# Patient Record
Sex: Female | Born: 1970
Health system: Southern US, Community
[De-identification: ages and names within clinical notes are randomized; demographics above are authoritative.]

## PROBLEM LIST (undated history)

## (undated) DIAGNOSIS — E785 Hyperlipidemia, unspecified: Secondary | ICD-10-CM

## (undated) DIAGNOSIS — Z9109 Other allergy status, other than to drugs and biological substances: Secondary | ICD-10-CM

## (undated) DIAGNOSIS — J45909 Unspecified asthma, uncomplicated: Secondary | ICD-10-CM

## (undated) DIAGNOSIS — Z90710 Acquired absence of both cervix and uterus: Secondary | ICD-10-CM

## (undated) DIAGNOSIS — D259 Leiomyoma of uterus, unspecified: Secondary | ICD-10-CM

## (undated) DIAGNOSIS — R062 Wheezing: Secondary | ICD-10-CM

## (undated) DIAGNOSIS — I10 Essential (primary) hypertension: Secondary | ICD-10-CM

## (undated) DIAGNOSIS — E669 Obesity, unspecified: Secondary | ICD-10-CM

## (undated) DIAGNOSIS — T7840XA Allergy, unspecified, initial encounter: Secondary | ICD-10-CM

## (undated) DIAGNOSIS — R739 Hyperglycemia, unspecified: Secondary | ICD-10-CM

## (undated) HISTORY — PX: ABDOMINAL HYSTERECTOMY: SHX81

## (undated) HISTORY — DX: Leiomyoma of uterus, unspecified: D25.9

## (undated) HISTORY — PX: FRACTURE SURGERY: SHX138

## (undated) HISTORY — DX: Allergy, unspecified, initial encounter: T78.40XA

## (undated) HISTORY — DX: Obesity, unspecified: E66.9

---
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
## 2016-10-29 DIAGNOSIS — M9901 Segmental and somatic dysfunction of cervical region: Secondary | ICD-10-CM | POA: Diagnosis not present

## 2016-10-29 DIAGNOSIS — M5136 Other intervertebral disc degeneration, lumbar region: Secondary | ICD-10-CM | POA: Diagnosis not present

## 2016-10-29 DIAGNOSIS — M9903 Segmental and somatic dysfunction of lumbar region: Secondary | ICD-10-CM | POA: Diagnosis not present

## 2016-10-29 DIAGNOSIS — M9905 Segmental and somatic dysfunction of pelvic region: Secondary | ICD-10-CM | POA: Diagnosis not present

## 2016-10-30 DIAGNOSIS — M9901 Segmental and somatic dysfunction of cervical region: Secondary | ICD-10-CM | POA: Diagnosis not present

## 2016-10-30 DIAGNOSIS — M5136 Other intervertebral disc degeneration, lumbar region: Secondary | ICD-10-CM | POA: Diagnosis not present

## 2016-10-30 DIAGNOSIS — M9903 Segmental and somatic dysfunction of lumbar region: Secondary | ICD-10-CM | POA: Diagnosis not present

## 2016-10-30 DIAGNOSIS — M9905 Segmental and somatic dysfunction of pelvic region: Secondary | ICD-10-CM | POA: Diagnosis not present

## 2016-11-03 DIAGNOSIS — M9901 Segmental and somatic dysfunction of cervical region: Secondary | ICD-10-CM | POA: Diagnosis not present

## 2016-11-03 DIAGNOSIS — M5136 Other intervertebral disc degeneration, lumbar region: Secondary | ICD-10-CM | POA: Diagnosis not present

## 2016-11-03 DIAGNOSIS — M9903 Segmental and somatic dysfunction of lumbar region: Secondary | ICD-10-CM | POA: Diagnosis not present

## 2016-11-03 DIAGNOSIS — M9905 Segmental and somatic dysfunction of pelvic region: Secondary | ICD-10-CM | POA: Diagnosis not present

## 2016-11-04 DIAGNOSIS — M9905 Segmental and somatic dysfunction of pelvic region: Secondary | ICD-10-CM | POA: Diagnosis not present

## 2016-11-04 DIAGNOSIS — M5136 Other intervertebral disc degeneration, lumbar region: Secondary | ICD-10-CM | POA: Diagnosis not present

## 2016-11-04 DIAGNOSIS — M9901 Segmental and somatic dysfunction of cervical region: Secondary | ICD-10-CM | POA: Diagnosis not present

## 2016-11-04 DIAGNOSIS — M9903 Segmental and somatic dysfunction of lumbar region: Secondary | ICD-10-CM | POA: Diagnosis not present

## 2016-11-07 DIAGNOSIS — M9905 Segmental and somatic dysfunction of pelvic region: Secondary | ICD-10-CM | POA: Diagnosis not present

## 2016-11-07 DIAGNOSIS — M9903 Segmental and somatic dysfunction of lumbar region: Secondary | ICD-10-CM | POA: Diagnosis not present

## 2016-11-07 DIAGNOSIS — M5136 Other intervertebral disc degeneration, lumbar region: Secondary | ICD-10-CM | POA: Diagnosis not present

## 2016-11-07 DIAGNOSIS — M9901 Segmental and somatic dysfunction of cervical region: Secondary | ICD-10-CM | POA: Diagnosis not present

## 2016-11-10 DIAGNOSIS — M9901 Segmental and somatic dysfunction of cervical region: Secondary | ICD-10-CM | POA: Diagnosis not present

## 2016-11-10 DIAGNOSIS — M5136 Other intervertebral disc degeneration, lumbar region: Secondary | ICD-10-CM | POA: Diagnosis not present

## 2016-11-10 DIAGNOSIS — M9903 Segmental and somatic dysfunction of lumbar region: Secondary | ICD-10-CM | POA: Diagnosis not present

## 2016-11-10 DIAGNOSIS — M9905 Segmental and somatic dysfunction of pelvic region: Secondary | ICD-10-CM | POA: Diagnosis not present

## 2016-11-14 DIAGNOSIS — M5136 Other intervertebral disc degeneration, lumbar region: Secondary | ICD-10-CM | POA: Diagnosis not present

## 2016-11-14 DIAGNOSIS — M9901 Segmental and somatic dysfunction of cervical region: Secondary | ICD-10-CM | POA: Diagnosis not present

## 2016-11-14 DIAGNOSIS — M9903 Segmental and somatic dysfunction of lumbar region: Secondary | ICD-10-CM | POA: Diagnosis not present

## 2016-11-14 DIAGNOSIS — M9905 Segmental and somatic dysfunction of pelvic region: Secondary | ICD-10-CM | POA: Diagnosis not present

## 2017-05-21 DIAGNOSIS — I10 Essential (primary) hypertension: Secondary | ICD-10-CM | POA: Diagnosis not present

## 2017-05-21 DIAGNOSIS — Z90710 Acquired absence of both cervix and uterus: Secondary | ICD-10-CM | POA: Diagnosis not present

## 2017-05-21 DIAGNOSIS — Z6827 Body mass index (BMI) 27.0-27.9, adult: Secondary | ICD-10-CM | POA: Diagnosis not present

## 2017-05-21 DIAGNOSIS — Z0000 Encounter for general adult medical examination without abnormal findings: Secondary | ICD-10-CM | POA: Diagnosis not present

## 2017-05-21 DIAGNOSIS — D649 Anemia, unspecified: Secondary | ICD-10-CM | POA: Diagnosis not present

## 2018-08-19 ENCOUNTER — Emergency Department (INDEPENDENT_AMBULATORY_CARE_PROVIDER_SITE_OTHER)
Admission: EM | Admit: 2018-08-19 | Discharge: 2018-08-19 | Disposition: A | Discharge status: Home / Self Care | Payer: BLUE CROSS/BLUE SHIELD | Source: Home / Self Care | Attending: Family Medicine | Admitting: Family Medicine

## 2018-08-19 ENCOUNTER — Other Ambulatory Visit: Payer: Self-pay

## 2018-08-19 ENCOUNTER — Encounter: Payer: Self-pay | Admitting: *Deleted

## 2018-08-19 DIAGNOSIS — H539 Unspecified visual disturbance: Secondary | ICD-10-CM

## 2018-08-19 DIAGNOSIS — I10 Essential (primary) hypertension: Secondary | ICD-10-CM | POA: Diagnosis not present

## 2018-08-19 HISTORY — DX: Essential (primary) hypertension: I10

## 2018-08-19 LAB — POCT CBC W AUTO DIFF (K'VILLE URGENT CARE)

## 2018-08-19 NOTE — ED Triage Notes (Signed)
Patient c/o 3-4 days of intermittent "glazed vision", more frequent in the past day. She has also has noted feeling more easily winded with exertion. She is in need of a refill on her BP medication this month and a PCP. She still has a supply. Moved away from PCP.

## 2018-08-19 NOTE — ED Provider Notes (Signed)
[facility]    CSN: [id number] Arrival date & time: 08/19/18  1358     History   Chief Complaint Chief Complaint  Patient presents with  . Blurred Vision  . Hypertension    HPI Ashley Roberts is a 47 y.o. female.   Patient has had hypertension for 20+ years, controlled with amlodipine and Benicar.  During the past  3 to 4 days she has been fatigued, becoming more easily winded with activity and feels tight in her chest with inspiration.  She has had intermittent "glazed vision" but denies other neurologic symptoms.  She reports that she has moved to the area and is in need of a PCP.     Past Medical History:  Diagnosis Date  . Hypertension     Active problems:  hypertension    Past Surgical History:  Procedure Laterality Date  . ABDOMINAL HYSTERECTOMY      OB History   Noncontributory      Home Medications    Prior to Admission medications   Medication Sig Start Date End Date Taking? Authorizing Provider  AMLODIPINE BESYLATE PO Take 5 mg by mouth.   Yes [provider]  olmesartan-hydrochlorothiazide (BENICAR HCT) 40-25 MG tablet Take 1 tablet by mouth daily.   Yes [provider]    Family History Family History  Problem Relation Age of Onset  . Hypertension Mother     Social History Social History   Tobacco Use  . Smoking status: Never Smoker  . Smokeless tobacco: Never Used  Substance Use Topics  . Alcohol use: Yes  . Drug use: Never     Allergies   Patient has no known allergies.   Review of Systems Review of Systems  Constitutional: Negative for activity change, appetite change, chills, diaphoresis, fatigue, fever and unexpected weight change.  HENT: Negative.   Eyes: Positive for visual disturbance.  Respiratory: Positive for shortness of breath. Negative for cough, chest tightness, wheezing and stridor.   Cardiovascular: Negative.   Gastrointestinal: Negative.   Genitourinary: Negative.     Musculoskeletal: Negative.   Skin: Negative.   Neurological: Negative for dizziness, seizures, syncope, facial asymmetry, speech difficulty, weakness, light-headedness, numbness and headaches.     Physical Exam Triage Vital Signs ED Triage Vitals [08/19/18 1430]  Enc Vitals Group     BP (!) 147/93     Pulse Rate 77     Resp 14     Temp 98.9 F (37.2 C)     Temp Source Oral     SpO2 99 %     Weight 231 lb (104.8 kg)     Height 5\' 9"  (1.753 m)     Head Circumference      Peak Flow      Pain Score 0     Pain Loc      Pain Edu?      Excl. in [place]?    No data found.  Updated Vital Signs BP (!) 147/93 (BP Location: Right Arm)   Pulse 77   Temp 98.9 F (37.2 C) (Oral)   Resp 14   Ht 5\' 9"  (1.753 m)   Wt 104.8 kg   SpO2 99%   BMI 34.11 kg/m   Visual Acuity Right Eye Distance: 20/30 Left Eye Distance: 20/30 Bilateral Distance: 20/30(glasses)  Right Eye Near:   Left Eye Near:    Bilateral Near:     Physical Exam Nursing notes and Vital Signs reviewed. Appearance:  Patient appears stated age,  and in no acute distress Eyes:  Pupils are equal, round, and reactive to light and accomodation.  Extraocular movement is intact.  Conjunctivae are not inflamed.  Fundi benign.  Ears:  Canals normal.  Tympanic membranes normal.  Nose:   Normal turbinates.  No sinus tenderness.    Pharynx:  Normal Neck:  Supple.  No adenopathy or thyromegaly.  Carotids have normal upstrokes without bruits. Lungs:  Clear to auscultation.  Breath sounds are equal.  Moving air well. Heart:  Regular rate and rhythm without murmurs, rubs, or gallops.  Abdomen:  Nontender without masses or hepatosplenomegaly.  Bowel sounds are present.  No CVA or flank tenderness.  Extremities:  No edema.  Pedal pulses intact. Skin:  No rash present.   Neurologic:  Cranial nerves 2 through 12 are normal.  Patellar, achilles, and elbow reflexes are normal.  Cerebellar function is intact (finger-to-nose and rapid  alternating hand movement).  Gait and station are normal.   .   UC Treatments / Results  Labs (all labs ordered are listed, but only abnormal results are displayed) Labs Reviewed  POCT CBC W AUTO DIFF ([facility]):  WBC 7.2; LY 31.2; MO 5.2; GR 63.6; Hgb 13.5; Platelets 255    EKG  Rate:  70 BPM PR:  140 msec QT:  392 msec QTcH:  423 msec QRSD:  78 msec QRS axis:  23 degrees Interpretation:  within normal limits   Radiology No results found.  Procedures Procedures (including critical care time)  Medications Ordered in UC Medications - No data to display  Initial Impression / Assessment and Plan / UC Course  I have reviewed the triage vital signs and the nursing notes.  Pertinent labs & imaging results that were available during my care of the patient were reviewed by me and considered in my medical decision making (see chart for details).    Benign exam today. Followup with PCP for BP management.  Recommend follow-up with ophthalmologist for annual eye exam   Final Clinical Impressions(s) / UC Diagnoses   Final diagnoses:  Essential hypertension, benign  Vision changes     Discharge Instructions     Continue present medications.  Minimize salt and sodium intake. Monitor blood pressure more frequently at different times of day and record on a calendar.     ED Prescriptions    None        Kandra Nicolas, MD 08/22/18 2110

## 2018-08-19 NOTE — Discharge Instructions (Addendum)
Continue present medications.  Minimize salt and sodium intake. Monitor blood pressure more frequently at different times of day and record on a calendar.

## 2018-08-24 ENCOUNTER — Encounter: Payer: Self-pay | Admitting: Physician Assistant

## 2018-08-24 ENCOUNTER — Ambulatory Visit (INDEPENDENT_AMBULATORY_CARE_PROVIDER_SITE_OTHER): Payer: BLUE CROSS/BLUE SHIELD

## 2018-08-24 ENCOUNTER — Ambulatory Visit (INDEPENDENT_AMBULATORY_CARE_PROVIDER_SITE_OTHER): Payer: BLUE CROSS/BLUE SHIELD | Admitting: Physician Assistant

## 2018-08-24 VITALS — BP 118/77 | HR 80 | Ht 69.0 in | Wt 233.0 lb

## 2018-08-24 DIAGNOSIS — Z7689 Persons encountering health services in other specified circumstances: Secondary | ICD-10-CM | POA: Diagnosis not present

## 2018-08-24 DIAGNOSIS — R0600 Dyspnea, unspecified: Secondary | ICD-10-CM

## 2018-08-24 DIAGNOSIS — R062 Wheezing: Secondary | ICD-10-CM

## 2018-08-24 DIAGNOSIS — I10 Essential (primary) hypertension: Secondary | ICD-10-CM | POA: Diagnosis not present

## 2018-08-24 DIAGNOSIS — R0609 Other forms of dyspnea: Secondary | ICD-10-CM | POA: Diagnosis not present

## 2018-08-24 DIAGNOSIS — Z23 Encounter for immunization: Secondary | ICD-10-CM

## 2018-08-24 DIAGNOSIS — Z1231 Encounter for screening mammogram for malignant neoplasm of breast: Secondary | ICD-10-CM | POA: Diagnosis not present

## 2018-08-24 DIAGNOSIS — Z13220 Encounter for screening for lipoid disorders: Secondary | ICD-10-CM

## 2018-08-24 DIAGNOSIS — Z90710 Acquired absence of both cervix and uterus: Secondary | ICD-10-CM | POA: Diagnosis not present

## 2018-08-24 HISTORY — DX: Essential (primary) hypertension: I10

## 2018-08-24 NOTE — Progress Notes (Signed)
HPI:                                                                Ashley Roberts is a 47 y.o. female who presents to [facility] today to establish care  Current concerns: HTN  HTN: longstanding history for 20 years. Taking Olmesartan-HCTZ and Amlodipine daily. Compliant with medications.  Risk factors include: family hx, obesity  She was seen in urgent care on 08/19/18. Reports she "just wasn't feeling myself."  She had noticed a slight change in vision last week and she saw an Optometrist, who reported that she needed a new prescription for bifocals. She has also noticed becoming more easily winded and frequent throat clearing. She has a history of environmental allergies Reports she feels fine today. Denies headache, chest pain with exertion, orthopnea, lightheadedness, syncope and edema.    Depression screen [facility] 2/9 08/24/2018  Decreased Interest 0  Down, Depressed, Hopeless 0  PHQ - 2 Score 0  Altered sleeping 0  Tired, decreased energy 1  Change in appetite 0  Feeling bad or failure about yourself  0  Trouble concentrating 0  Moving slowly or fidgety/restless 0  Suicidal thoughts 0  PHQ-9 Score 1        Past Medical History:  Diagnosis Date  . Allergy   . Hypertension   . Obesity   . Uterine fibroid    Past Surgical History:  Procedure Laterality Date  . ABDOMINAL HYSTERECTOMY     Social History   Tobacco Use  . Smoking status: Never Smoker  . Smokeless tobacco: Never Used  Substance Use Topics  . Alcohol use: Not Currently   family history includes Asthma in her cousin; Breast cancer in her maternal aunt; Cancer in her maternal uncle; Diabetes in her maternal grandmother; Heart attack in her maternal grandmother; Hypertension in her mother; Prostate cancer in her maternal grandfather.    ROS: Review of Systems  Eyes: Positive for blurred vision.  Respiratory: Positive for shortness of breath (with  exertion). Negative for cough, sputum production and wheezing.        + chest tightness + throat clearing  Cardiovascular: Negative for chest pain.  Gastrointestinal: Negative for heartburn.     Medications: Current Outpatient Medications  Medication Sig Dispense Refill  . amLODipine (NORVASC) 5 MG tablet Take 5 mg by mouth daily.    . fexofenadine (ALLEGRA) 180 MG tablet Take 180 mg by mouth daily.    Marland Kitchen olmesartan-hydrochlorothiazide (BENICAR HCT) 40-25 MG tablet Take 1 tablet by mouth daily.     No current facility-administered medications for this visit.    No Known Allergies     Objective:  BP 118/77   Pulse 80   Ht 5\' 9"  (1.753 m)   Wt 233 lb (105.7 kg)   SpO2 98%   BMI 34.41 kg/m  Gen:  alert, not ill-appearing, no distress, appropriate for age, obese female HEENT: head normocephalic without obvious abnormality, conjunctiva and cornea clear, trachea midline Pulm: Normal work of breathing, normal phonation, clear to auscultation bilaterally, end expiratory wheeze left upper lobe CV: Normal rate, regular rhythm, s1 and s2 distinct, no murmurs, clicks or rubs  Neuro: alert and oriented x 3, no tremor MSK: extremities atraumatic,  normal gait and station Skin: intact, no rashes on exposed skin, no jaundice, no cyanosis Psych: well-groomed, cooperative, good eye contact, euthymic mood, affect mood-congruent, speech is articulate, and thought processes clear and goal-directed    No results found for this or any previous visit (from the past 72 hour(s)). No results found.    Assessment and Plan: 47 y.o. female with   .Joseph was seen today for establish care.  Diagnoses and all orders for this visit:  Encounter to establish care  Breast cancer screening by mammogram -     MM 3D SCREEN BREAST BILATERAL; Future  History of hysterectomy for benign disease  Hypertension goal BP (blood pressure) < 130/80 -     Comprehensive metabolic panel -     Lipid Panel  w/reflex Direct LDL  Screening for lipid disorders -     Lipid Panel w/reflex Direct LDL  Expiratory wheezing on left side of chest -     DG Chest 2 View  Dyspnea on exertion   - Personally reviewed PMH, PSH, PFH, medications, allergies, HM - Age-appropriate cancer screening: Pap UTD per patient, requesting records from [facility]; mammogram overdue, order placed today - Tdap declined - PHQ2 negative  HTN - BP in range today - continue current medications and therapeutic lifestyle changes - fasting labs pending  Wheezing/Dyspnea - afebrile, no tachypnea, SpO2 98% on RA at rest - symptoms c/f asthma / reactive airway disease. CXR today. Return in 1 week for PFT's  Patient education and anticipatory guidance given Patient agrees with treatment plan Follow-up as needed if symptoms worsen or fail to improve  Darlyne Russian PA-C

## 2018-08-24 NOTE — Patient Instructions (Signed)
For your blood pressure: - Goal <130/80 - monitor and log blood pressures at home - check around the same time each day in a relaxed setting - Limit salt to <2000 mg/day - Follow DASH eating plan - limit alcohol to 2 standard drinks per day for men and 1 per day for women - avoid tobacco products - weight loss: 7% of current body weight - follow-up every 6 months for your blood pressure

## 2018-08-25 ENCOUNTER — Encounter: Payer: Self-pay | Admitting: Physician Assistant

## 2018-08-25 DIAGNOSIS — Z90710 Acquired absence of both cervix and uterus: Secondary | ICD-10-CM | POA: Insufficient documentation

## 2018-08-25 DIAGNOSIS — R0609 Other forms of dyspnea: Secondary | ICD-10-CM

## 2018-08-25 DIAGNOSIS — R0600 Dyspnea, unspecified: Secondary | ICD-10-CM

## 2018-08-25 DIAGNOSIS — R062 Wheezing: Secondary | ICD-10-CM | POA: Insufficient documentation

## 2018-08-25 HISTORY — DX: Acquired absence of both cervix and uterus: Z90.710

## 2018-08-25 HISTORY — DX: Dyspnea, unspecified: R06.00

## 2018-08-25 HISTORY — DX: Other forms of dyspnea: R06.09

## 2018-08-25 HISTORY — DX: Wheezing: R06.2

## 2018-08-30 DIAGNOSIS — I10 Essential (primary) hypertension: Secondary | ICD-10-CM | POA: Diagnosis not present

## 2018-08-30 DIAGNOSIS — Z13220 Encounter for screening for lipoid disorders: Secondary | ICD-10-CM | POA: Diagnosis not present

## 2018-08-30 LAB — COMPREHENSIVE METABOLIC PANEL
AG Ratio: 1.1 (calc) (ref 1.0–2.5)
ALT: 10 U/L (ref 6–29)
AST: 13 U/L (ref 10–35)
Albumin: 3.8 g/dL (ref 3.6–5.1)
Alkaline phosphatase (APISO): 60 U/L (ref 33–115)
BUN: 10 mg/dL (ref 7–25)
CO2: 28 mmol/L (ref 20–32)
Calcium: 9.2 mg/dL (ref 8.6–10.2)
Chloride: 105 mmol/L (ref 98–110)
Creat: 0.93 mg/dL (ref 0.50–1.10)
Globulin: 3.6 g/dL (calc) (ref 1.9–3.7)
Glucose, Bld: 106 mg/dL — ABNORMAL HIGH (ref 65–99)
Potassium: 3.7 mmol/L (ref 3.5–5.3)
Sodium: 139 mmol/L (ref 135–146)
Total Bilirubin: 0.4 mg/dL (ref 0.2–1.2)
Total Protein: 7.4 g/dL (ref 6.1–8.1)

## 2018-08-30 LAB — LIPID PANEL W/REFLEX DIRECT LDL
Cholesterol: 174 mg/dL (ref <200)
HDL: 33 mg/dL — ABNORMAL LOW (ref >50)
LDL Cholesterol (Calc): 121 mg/dL (calc) — ABNORMAL HIGH
Non-HDL Cholesterol (Calc): 141 mg/dL (calc) — ABNORMAL HIGH (ref <130)
Total CHOL/HDL Ratio: 5.3 (calc) — ABNORMAL HIGH (ref <5.0)
Triglycerides: 94 mg/dL (ref <150)

## 2018-08-31 ENCOUNTER — Encounter: Payer: Self-pay | Admitting: Physician Assistant

## 2018-08-31 DIAGNOSIS — R739 Hyperglycemia, unspecified: Secondary | ICD-10-CM

## 2018-08-31 DIAGNOSIS — E785 Hyperlipidemia, unspecified: Secondary | ICD-10-CM

## 2018-08-31 HISTORY — DX: Hyperglycemia, unspecified: R73.9

## 2018-08-31 HISTORY — DX: Hyperlipidemia, unspecified: E78.5

## 2018-09-01 ENCOUNTER — Ambulatory Visit (INDEPENDENT_AMBULATORY_CARE_PROVIDER_SITE_OTHER): Payer: BLUE CROSS/BLUE SHIELD | Admitting: Physician Assistant

## 2018-09-01 ENCOUNTER — Ambulatory Visit (INDEPENDENT_AMBULATORY_CARE_PROVIDER_SITE_OTHER): Payer: BLUE CROSS/BLUE SHIELD

## 2018-09-01 VITALS — BP 133/78 | HR 75 | Ht 69.0 in | Wt 232.0 lb

## 2018-09-01 DIAGNOSIS — Z008 Encounter for other general examination: Secondary | ICD-10-CM | POA: Diagnosis not present

## 2018-09-01 DIAGNOSIS — R0600 Dyspnea, unspecified: Secondary | ICD-10-CM

## 2018-09-01 DIAGNOSIS — R0609 Other forms of dyspnea: Secondary | ICD-10-CM

## 2018-09-01 DIAGNOSIS — Z1231 Encounter for screening mammogram for malignant neoplasm of breast: Secondary | ICD-10-CM

## 2018-09-01 DIAGNOSIS — J45909 Unspecified asthma, uncomplicated: Secondary | ICD-10-CM | POA: Insufficient documentation

## 2018-09-01 DIAGNOSIS — R7301 Impaired fasting glucose: Secondary | ICD-10-CM

## 2018-09-01 DIAGNOSIS — J4520 Mild intermittent asthma, uncomplicated: Secondary | ICD-10-CM

## 2018-09-01 HISTORY — DX: Unspecified asthma, uncomplicated: J45.909

## 2018-09-01 LAB — PULMONARY FUNCTION TEST

## 2018-09-01 LAB — POCT GLYCOSYLATED HEMOGLOBIN (HGB A1C): HbA1c POC (<> result, manual entry): 5.6 % (ref 4.0–5.6)

## 2018-09-01 MED ORDER — ALBUTEROL SULFATE HFA 108 (90 BASE) MCG/ACT IN AERS: 1.0000 | INHALATION_SPRAY | RESPIRATORY_TRACT | 3 refills | Status: DC | PRN

## 2018-09-01 MED ORDER — ALBUTEROL SULFATE (2.5 MG/3ML) 0.083% IN NEBU
2.5000 mg | INHALATION_SOLUTION | Freq: Once | RESPIRATORY_TRACT | Status: AC
  Administered 2018-09-01: 2.5 mg via RESPIRATORY_TRACT

## 2018-09-01 NOTE — Patient Instructions (Signed)
Asthma, Adult °Asthma is a recurring condition in which the airways tighten and narrow. Asthma can make it difficult to breathe. It can cause coughing, wheezing, and shortness of breath. Asthma episodes, also called asthma attacks, range from minor to life-threatening. Asthma cannot be cured, but medicines and lifestyle changes can help control it. °What are the causes? °Asthma is believed to be caused by inherited (genetic) and environmental factors, but its exact cause is unknown. Asthma may be triggered by allergens, lung infections, or irritants in the air. Asthma triggers are different for each person. Common triggers include: °· Animal dander. °· Dust mites. °· Cockroaches. °· Pollen from trees or grass. °· Mold. °· Smoke. °· Air pollutants such as dust, household cleaners, hair sprays, aerosol sprays, paint fumes, strong chemicals, or strong odors. °· Cold air, weather changes, and winds (which increase molds and pollens in the air). °· Strong emotional expressions such as crying or laughing hard. °· Stress. °· Certain medicines (such as aspirin) or types of drugs (such as beta-blockers). °· Sulfites in foods and drinks. Foods and drinks that may contain sulfites include dried fruit, potato chips, and sparkling grape juice. °· Infections or inflammatory conditions such as the flu, a cold, or an inflammation of the nasal membranes (rhinitis). °· Gastroesophageal reflux disease (GERD). °· Exercise or strenuous activity. ° °What are the signs or symptoms? °Symptoms may occur immediately after asthma is triggered or many hours later. Symptoms include: °· Wheezing. °· Excessive nighttime or early morning coughing. °· Frequent or severe coughing with a common cold. °· Chest tightness. °· Shortness of breath. ° °How is this diagnosed? °The diagnosis of asthma is made by a review of your medical history and a physical exam. Tests may also be performed. These may include: °· Lung function studies. These tests show how  much air you breathe in and out. °· Allergy tests. °· Imaging tests such as X-rays. ° °How is this treated? °Asthma cannot be cured, but it can usually be controlled. Treatment involves identifying and avoiding your asthma triggers. It also involves medicines. There are 2 classes of medicine used for asthma treatment: °· Controller medicines. These prevent asthma symptoms from occurring. They are usually taken every day. °· Reliever or rescue medicines. These quickly relieve asthma symptoms. They are used as needed and provide short-term relief. ° °Your health care provider will help you create an asthma action plan. An asthma action plan is a written plan for managing and treating your asthma attacks. It includes a list of your asthma triggers and how they may be avoided. It also includes information on when medicines should be taken and when their dosage should be changed. An action plan may also involve the use of a device called a peak flow meter. A peak flow meter measures how well the lungs are working. It helps you monitor your condition. °Follow these instructions at home: °· Take medicines only as directed by your health care provider. Speak with your health care provider if you have questions about how or when to take the medicines. °· Use a peak flow meter as directed by your health care provider. Record and keep track of readings. °· Understand and use the action plan to help minimize or stop an asthma attack without needing to seek medical care. °· Control your home environment in the following ways to help prevent asthma attacks: °? Do not smoke. Avoid being exposed to secondhand smoke. °? Change your heating and air conditioning filter regularly. °? Limit   your use of fireplaces and wood stoves. °? Get rid of pests (such as roaches and mice) and their droppings. °? Throw away plants if you see mold on them. °? Clean your floors and dust regularly. Use unscented cleaning products. °? Try to have someone  else vacuum for you regularly. Stay out of rooms while they are being vacuumed and for a short while afterward. If you vacuum, use a dust mask from a hardware store, a double-layered or microfilter vacuum cleaner bag, or a vacuum cleaner with a HEPA filter. °? Replace carpet with wood, tile, or vinyl flooring. Carpet can trap dander and dust. °? Use allergy-proof pillows, mattress covers, and box spring covers. °? Wash bed sheets and blankets every week in hot water and dry them in a dryer. °? Use blankets that are made of polyester or cotton. °? Clean bathrooms and kitchens with bleach. If possible, have someone repaint the walls in these rooms with mold-resistant paint. Keep out of the rooms that are being cleaned and painted. °? Wash hands frequently. °Contact a health care provider if: °· You have wheezing, shortness of breath, or a cough even if taking medicine to prevent attacks. °· The colored mucus you cough up (sputum) is thicker than usual. °· Your sputum changes from clear or white to yellow, green, gray, or bloody. °· You have any problems that may be related to the medicines you are taking (such as a rash, itching, swelling, or trouble breathing). °· You are using a reliever medicine more than 2-3 times per week. °· Your peak flow is still at 50-79% of your personal best after following your action plan for 1 hour. °· You have a fever. °Get help right away if: °· You seem to be getting worse and are unresponsive to treatment during an asthma attack. °· You are short of breath even at rest. °· You get short of breath when doing very little physical activity. °· You have difficulty eating, drinking, or talking due to asthma symptoms. °· You develop chest pain. °· You develop a fast heartbeat. °· You have a bluish color to your lips or fingernails. °· You are light-headed, dizzy, or faint. °· Your peak flow is less than 50% of your personal best. °This information is not intended to replace advice given to  you by your health care provider. Make sure you discuss any questions you have with your health care provider. °Document Released: 11/17/2005 Document Revised: 04/30/2016 Document Reviewed: 06/16/2013 °Elsevier Interactive Patient Education © 2017 [employer] ° ° °Asthma Attack Prevention, Adult °Although you may not be able to control the fact that you have asthma, you can take actions to prevent episodes of asthma (asthma attacks). These actions include: °· Creating a written plan for managing and treating your asthma attacks (asthma action plan). °· Monitoring your asthma. °· Avoiding things that can irritate your airways or make your asthma symptoms worse (asthma triggers). °· Taking your medicines as directed. °· Acting quickly if you have signs or symptoms of an asthma attack. ° °What are some ways to prevent an asthma attack? °Create a plan °Work with your health care provider to create an asthma action plan. This plan should include: °· A list of your asthma triggers and how to avoid them. °· A list of symptoms that you experience during an asthma attack. °· Information about when to take medicine and how much medicine to take. °· Information to help you understand your peak flow measurements. °· Contact information   for your health care providers. °· Daily actions that you can take to control asthma. ° °Monitor your asthma ° °To monitor your asthma: °· Use your peak flow meter every morning and every evening for 2-3 weeks. Record the results in a journal. A drop in your peak flow numbers on one or more days may mean that you are starting to have an asthma attack, even if you are not having symptoms. °· When you have asthma symptoms, write them down in a journal. ° °Avoid asthma triggers ° °Work with your health care provider to find out what your asthma triggers are. This can be done by: °· Being tested for allergies. °· Keeping a journal that notes when asthma attacks occur and what may have contributed  to them. °· Asking your health care provider whether other medical conditions make your asthma worse. ° °Common asthma triggers include: °· Dust. °· Smoke. This includes campfire smoke and secondhand smoke from tobacco products. °· Pet dander. °· Trees, grasses or pollens. °· Very cold, dry, or humid air. °· Mold. °· Foods that contain high amounts of sulfites. °· Strong smells. °· Engine exhaust and air pollution. °· Aerosol sprays and fumes from household cleaners. °· Household pests and their droppings, including dust mites and cockroaches. °· Certain medicines, including NSAIDs. ° °Once you have determined your asthma triggers, take steps to avoid them. Depending on your triggers, you may be able to reduce the chance of an asthma attack by: °· Keeping your home clean. Have someone dust and vacuum your home for you 1 or 2 times a week. If possible, have them use a high-efficiency particulate arrestance (HEPA) vacuum. °· Washing your sheets weekly in hot water. °· Using allergy-proof mattress covers and casings on your bed. °· Keeping pets out of your home. °· Taking care of mold and water problems in your home. °· Avoiding areas where people smoke. °· Avoiding using strong perfumes or odor sprays. °· Avoid spending a lot of time outdoors when pollen counts are high and on very windy days. °· Talking with your health care provider before stopping or starting any new medicines. ° °Medicines °Take over-the-counter and prescription medicines only as told by your health care provider. Many asthma attacks can be prevented by carefully following your medicine schedule. Taking your medicines correctly is especially important when you cannot avoid certain asthma triggers. Even if you are doing well, do not stop taking your medicine and do not take less medicine. °Act quickly °If an asthma attack happens, acting quickly can decrease how severe it is and how long it lasts. Take these actions: °· Pay attention to your  symptoms. If you are coughing, wheezing, or having difficulty breathing, do not wait to see if your symptoms go away on their own. Follow your asthma action plan. °· If you have followed your asthma action plan and your symptoms are not improving, call your health care provider or seek immediate medical care at the nearest hospital. ° °It is important to write down how often you need to use your fast-acting rescue inhaler. You can track how often you use an inhaler in your journal. If you are using your rescue inhaler more often, it may mean that your asthma is not under control. Adjusting your asthma treatment plan may help you to prevent future asthma attacks and help you to gain better control of your condition. °How can I prevent an asthma attack when I exercise? ° °Exercise is a common asthma trigger.   To prevent asthma attacks during exercise: °· Follow advice from your health care provider about whether you should use your fast-acting inhaler before exercising. Many people with asthma experience exercise-induced bronchoconstriction (EIB). This condition often worsens during vigorous exercise in cold, humid, or dry environments. Usually, people with EIB can stay very active by using a fast-acting inhaler before exercising. °· Avoid exercising outdoors in very cold or humid weather. °· Avoid exercising outdoors when pollen counts are high. °· Warm up and cool down when exercising. °· Stop exercising right away if asthma symptoms start. ° °Consider taking part in exercises that are less likely to cause asthma symptoms such as: °· Indoor swimming. °· Biking. °· Walking. °· Hiking. °· Playing football. ° °This information is not intended to replace advice given to you by your health care provider. Make sure you discuss any questions you have with your health care provider. °Document Released: 11/05/2009 Document Revised: 07/18/2016 Document Reviewed: 05/03/2016 °Elsevier Interactive Patient Education © 2018  [employer] ° °

## 2018-09-01 NOTE — Progress Notes (Signed)
HPI:                                                                Ashley Roberts is a 47 y.o. female who presents to [facility]: [place] today for PFT's  Maciel presents today for spirometry for decreased exercise tolerance/dyspnea on exertion for several months. At last OV on 08/24/18 expiratory wheezing was noted. She has a history of environmental allergies and family hx of asthma.  Past Medical History:  Diagnosis Date  . Allergy   . Hypertension   . Obesity   . Uterine fibroid    Past Surgical History:  Procedure Laterality Date  . ABDOMINAL HYSTERECTOMY     Social History   Tobacco Use  . Smoking status: Never Smoker  . Smokeless tobacco: Never Used  Substance Use Topics  . Alcohol use: Not Currently   family history includes Asthma in her cousin; Breast cancer in her maternal aunt; Cancer in her maternal uncle; Diabetes in her maternal aunt, maternal aunt, maternal aunt, maternal grandfather, and maternal grandmother; Heart attack in her maternal grandmother; Hypertension in her mother; Prostate cancer in her maternal grandfather; Renal Disease in her maternal aunt.    ROS: negative except as noted in the HPI  Medications: Current Outpatient Medications  Medication Sig Dispense Refill  . amLODipine (NORVASC) 5 MG tablet Take 5 mg by mouth daily.    . fexofenadine (ALLEGRA) 180 MG tablet Take 180 mg by mouth daily.    Marland Kitchen olmesartan-hydrochlorothiazide (BENICAR HCT) 40-25 MG tablet Take 1 tablet by mouth daily.    Marland Kitchen albuterol (PROVENTIL HFA;VENTOLIN HFA) 108 (90 Base) MCG/ACT inhaler Inhale 1-2 puffs into the lungs every 4 (four) hours as needed for wheezing or shortness of breath (chest tightness, bronchospasm). 1 Inhaler 3   No current facility-administered medications for this visit.    No Known Allergies     Objective:  BP 133/78   Pulse 75   Ht 5\' 9"  (1.753 m)   Wt 232 lb (105.2 kg)   SpO2 100%   PF 404 L/min    BMI 34.26 kg/m  Gen:  alert, not ill-appearing, no distress, appropriate for age 41: head normocephalic without obvious abnormality, conjunctiva and cornea clear, trachea midline Pulm: Normal work of breathing, normal phonation, clear to auscultation bilaterally, no wheezes, rales or rhonchi CV: Normal rate, regular rhythm, s1 and s2 distinct, no murmurs, clicks or rubs  Neuro: alert and oriented x 3, no tremor MSK: extremities atraumatic, normal gait and station Skin: intact, no rashes on exposed skin, no jaundice, no cyanosis Psych: well-groomed, cooperative, good eye contact, euthymic mood, affect mood-congruent, speech is articulate, and thought processes clear and goal-directed    Results for orders placed or performed in visit on 08/24/18 (from the past 72 hour(s))  Comprehensive metabolic panel     Status: Abnormal   Collection Time: 08/30/18  8:24 AM  Result Value Ref Range   Glucose, Bld 106 (H) 65 - 99 mg/dL    Comment: .            Fasting reference interval . For someone without known diabetes, a glucose value between 100 and 125 mg/dL is consistent with prediabetes and should be confirmed with a follow-up test. .  BUN 10 7 - 25 mg/dL   Creat 0.93 0.50 - 1.10 mg/dL   BUN/Creatinine Ratio NOT APPLICABLE 6 - 22 (calc)   Sodium 139 135 - 146 mmol/L   Potassium 3.7 3.5 - 5.3 mmol/L   Chloride 105 98 - 110 mmol/L   CO2 28 20 - 32 mmol/L   Calcium 9.2 8.6 - 10.2 mg/dL   Total Protein 7.4 6.1 - 8.1 g/dL   Albumin 3.8 3.6 - 5.1 g/dL   Globulin 3.6 1.9 - 3.7 g/dL (calc)   AG Ratio 1.1 1.0 - 2.5 (calc)   Total Bilirubin 0.4 0.2 - 1.2 mg/dL   Alkaline phosphatase (APISO) 60 33 - 115 U/L   AST 13 10 - 35 U/L   ALT 10 6 - 29 U/L  Lipid Panel w/reflex Direct LDL     Status: Abnormal   Collection Time: 08/30/18  8:24 AM  Result Value Ref Range   Cholesterol 174 <200 mg/dL   HDL 33 (L) >50 mg/dL   Triglycerides 94 <150 mg/dL   LDL Cholesterol (Calc) 121 (H) mg/dL  (calc)    Comment: Reference range: <100 . Desirable range <100 mg/dL for primary prevention;   <70 mg/dL for patients with CHD or diabetic patients  with > or = 2 CHD risk factors. Marland Kitchen LDL-C is now calculated using the Martin-Hopkins  calculation, which is a validated novel method providing  better accuracy than the Friedewald equation in the  estimation of LDL-C.  Cresenciano Genre et al. Annamaria Helling. 7628;315(17): 2061-2068  (http://education.[employer].com/faq/FAQ164)    Total CHOL/HDL Ratio 5.3 (H) <5.0 (calc)   Non-HDL Cholesterol (Calc) 141 (H) <130 mg/dL (calc)    Comment: For patients with diabetes plus 1 major ASCVD risk  factor, treating to a non-HDL-C goal of <100 mg/dL  (LDL-C of <70 mg/dL) is considered a therapeutic  option.    No results found.  Office Spirometry Results: Peak Flow: 404 L/min FEV1: 2.36 liters FVC: 3.12 liters FEV1/FVC: 75.6 % FVC  % Predicted: 88 % FEV % Predicted: 83 % FeF 25-75: 1.9 liters FeF 25-75 % Predicted: 65   Assessment and Plan: 47 y.o. female with   .Diagnoses and all orders for this visit:  Encounter for pulmonary function testing -     albuterol (PROVENTIL) (2.5 MG/3ML) 0.083% nebulizer solution 2.5 mg -     PR EVAL OF BRONCHOSPASM  Dyspnea on exertion -     albuterol (PROVENTIL) (2.5 MG/3ML) 0.083% nebulizer solution 2.5 mg -     PR EVAL OF BRONCHOSPASM  Mild intermittent reactive airway disease without complication -     albuterol (PROVENTIL HFA;VENTOLIN HFA) 108 (90 Base) MCG/ACT inhaler; Inhale 1-2 puffs into the lungs every 4 (four) hours as needed for wheezing or shortness of breath (chest tightness, bronchospasm).   Pre-bronchodilator spirometry normal However, patient demonstrated a significant improvement in PEF and FEV1 post-bronchodilator 18% and 14% respectively Combined with history of wheezing, DOE, normal CXR I think her symptoms are most consistent with mild reactive airway disease We discussed asthma  management including avoiding/managing triggers and bronchodilator prn Influenza immunization UTD Declines Tdap and Pneumovax  Fasting hyperglycemia of 106 and strong family hx of diabetes. POC A1C test was 5.6, normal today  Patient education and anticipatory guidance given Patient agrees with treatment plan Follow-up in 6 months or sooner as needed if symptoms worsen or fail to improve  Darlyne Russian PA-C

## 2018-09-12 ENCOUNTER — Encounter: Payer: Self-pay | Admitting: Physician Assistant

## 2018-10-26 ENCOUNTER — Other Ambulatory Visit: Payer: Self-pay

## 2018-10-26 MED ORDER — OLMESARTAN MEDOXOMIL-HCTZ 40-25 MG PO TABS: 1.0000 | ORAL_TABLET | Freq: Every day | ORAL | 1 refills | Status: DC

## 2018-10-26 NOTE — Telephone Encounter (Signed)
Pharmacy called for a refill on Benicar. Historical provider.

## 2018-12-06 ENCOUNTER — Other Ambulatory Visit: Payer: Self-pay

## 2018-12-06 ENCOUNTER — Emergency Department (INDEPENDENT_AMBULATORY_CARE_PROVIDER_SITE_OTHER)
Admission: EM | Admit: 2018-12-06 | Discharge: 2018-12-06 | Disposition: A | Discharge status: Home / Self Care | Payer: BLUE CROSS/BLUE SHIELD | Source: Home / Self Care | Attending: Family Medicine | Admitting: Family Medicine

## 2018-12-06 ENCOUNTER — Encounter: Payer: Self-pay | Admitting: Emergency Medicine

## 2018-12-06 DIAGNOSIS — J019 Acute sinusitis, unspecified: Secondary | ICD-10-CM

## 2018-12-06 DIAGNOSIS — B9689 Other specified bacterial agents as the cause of diseases classified elsewhere: Secondary | ICD-10-CM | POA: Diagnosis not present

## 2018-12-06 MED ORDER — AMOXICILLIN-POT CLAVULANATE 875-125 MG PO TABS: 1.0000 | ORAL_TABLET | Freq: Two times a day (BID) | ORAL | 0 refills | Status: DC

## 2018-12-06 NOTE — ED Provider Notes (Signed)
[name]    CSN: [id number] Arrival date & time: 12/06/18  0934     History   Chief Complaint Chief Complaint  Patient presents with  . Sinusitis    HPI Ashley Roberts is a 48 y.o. female.   HPI  Ashley Roberts is a 48 y.o. female presenting to UC with c/o 1 week of worsening sinus pain and pressure. Pt reports cold-like symptoms about 3 weeks ago, which started to improve but congestion worsened into sinus pain this past week. Associated loss of appetite and dry heaves. She has taken Aspirin and Mucinex with no relief. Hx of sinus infections in the past.   Past Medical History:  Diagnosis Date  . Allergy   . Hypertension   . Obesity   . Uterine fibroid     Patient Active Problem List   Diagnosis Date Noted  . Reactive airway disease 09/01/2018  . Hyperglycemia 08/31/2018  . Dyslipidemia 08/31/2018  . History of hysterectomy for benign disease 08/25/2018  . Expiratory wheezing on left side of chest 08/25/2018  . Dyspnea on exertion 08/25/2018  . Hypertension goal BP (blood pressure) < 130/80 08/24/2018  . Breast cancer screening by mammogram 08/24/2018    Past Surgical History:  Procedure Laterality Date  . ABDOMINAL HYSTERECTOMY      OB History    Gravida  1   Para  1   Term      Preterm      AB      Living  2     SAB      TAB      Ectopic      Multiple      Live Births               Home Medications    Prior to Admission medications   Medication Sig Start Date End Date Taking? Authorizing Provider  albuterol (PROVENTIL HFA;VENTOLIN HFA) 108 (90 Base) MCG/ACT inhaler Inhale 1-2 puffs into the lungs every 4 (four) hours as needed for wheezing or shortness of breath (chest tightness, bronchospasm). 09/01/18   Trixie Dredge, PA-C  amLODipine (NORVASC) 5 MG tablet Take 5 mg by mouth daily.    [provider]  amoxicillin-clavulanate (AUGMENTIN) 875-125 MG tablet Take 1 tablet by mouth 2 (two) times  daily. One po bid x 7 days 12/06/18   Noe Gens, PA-C  fexofenadine (ALLEGRA) 180 MG tablet Take 180 mg by mouth daily.    [provider]  olmesartan-hydrochlorothiazide (BENICAR HCT) 40-25 MG tablet Take 1 tablet by mouth daily. 10/26/18   Trixie Dredge, PA-C    Family History Family History  Problem Relation Age of Onset  . Hypertension Mother   . Heart attack Maternal Grandmother   . Diabetes Maternal Grandmother   . Breast cancer Maternal Aunt   . Diabetes Maternal Aunt   . Cancer Maternal Uncle   . Prostate cancer Maternal Grandfather   . Diabetes Maternal Grandfather   . Asthma Cousin   . Diabetes Maternal Aunt   . Renal Disease Maternal Aunt   . Diabetes Maternal Aunt     Social History Social History   Tobacco Use  . Smoking status: Never Smoker  . Smokeless tobacco: Never Used  Substance Use Topics  . Alcohol use: Yes  . Drug use: Never     Allergies   Patient has no known allergies.   Review of Systems Review of Systems  Constitutional: Negative for chills and  fever.  HENT: Positive for congestion, sinus pressure and sinus pain. Negative for ear pain, sore throat, trouble swallowing and voice change.   Respiratory: Positive for cough. Negative for shortness of breath.   Cardiovascular: Negative for chest pain and palpitations.  Gastrointestinal: Negative for abdominal pain, diarrhea, nausea and vomiting.  Musculoskeletal: Negative for arthralgias, back pain and myalgias.  Skin: Negative for rash.  Neurological: Positive for headaches. Negative for dizziness and light-headedness.     Physical Exam Triage Vital Signs ED Triage Vitals [12/06/18 1004]  Enc Vitals Group     BP (!) 129/91     Pulse Rate 85     Resp      Temp 98.3 F (36.8 C)     Temp Source Oral     SpO2 96 %     Weight 230 lb (104.3 kg)     Height 5\' 9"  (1.753 m)     Head Circumference      Peak Flow      Pain Score 5     Pain Loc      Pain Edu?       Excl. in [place]?    No data found.  Updated Vital Signs BP (!) 129/91 (BP Location: Right Arm)   Pulse 85   Temp 98.3 F (36.8 C) (Oral)   Ht 5\' 9"  (1.753 m)   Wt 230 lb (104.3 kg)   SpO2 96%   BMI 33.97 kg/m   Visual Acuity Right Eye Distance:   Left Eye Distance:   Bilateral Distance:    Right Eye Near:   Left Eye Near:    Bilateral Near:     Physical Exam Vitals signs and nursing note reviewed.  Constitutional:      Appearance: Normal appearance. She is well-developed.  HENT:     Head: Normocephalic and atraumatic.     Right Ear: Tympanic membrane normal.     Left Ear: Tympanic membrane normal.     Nose:     Right Sinus: Maxillary sinus tenderness and frontal sinus tenderness present.     Left Sinus: Maxillary sinus tenderness and frontal sinus tenderness present.     Mouth/Throat:     Lips: Pink.     Mouth: Mucous membranes are moist.     Pharynx: Oropharynx is clear.  Neck:     Musculoskeletal: Normal range of motion.  Cardiovascular:     Rate and Rhythm: Normal rate and regular rhythm.  Pulmonary:     Effort: Pulmonary effort is normal. No respiratory distress.     Breath sounds: Normal breath sounds. No stridor. No wheezing or rhonchi.  Musculoskeletal: Normal range of motion.  Skin:    General: Skin is warm and dry.  Neurological:     Mental Status: She is alert and oriented to person, place, and time.  Psychiatric:        Behavior: Behavior normal.      UC Treatments / Results  Labs (all labs ordered are listed, but only abnormal results are displayed) Labs Reviewed - No data to display  EKG None  Radiology No results found.  Procedures Procedures (including critical care time)  Medications Ordered in UC Medications - No data to display  Initial Impression / Assessment and Plan / UC Course  I have reviewed the triage vital signs and the nursing notes.  Pertinent labs & imaging results that were available during my care of the patient  were reviewed by me and considered in my medical decision  making (see chart for details).     Hx and exam c/w sinusitis Will tx with augmentin Continue flunose  Final Clinical Impressions(s) / UC Diagnoses   Final diagnoses:  Acute bacterial rhinosinusitis     Discharge Instructions      You may take 500mg  acetaminophen every 4-6 hours or in combination with ibuprofen 400-600mg  every 6-8 hours as needed for pain, inflammation, and fever.  Be sure to well hydrated with clear liquids and get at least 8 hours of sleep at night, preferably more while sick.   Please follow up with family medicine in 1 week if needed.  Please take antibiotics as prescribed and be sure to complete entire course even if you start to feel better to ensure infection does not come back.     ED Prescriptions    Medication Sig Dispense Auth. Provider   amoxicillin-clavulanate (AUGMENTIN) 875-125 MG tablet Take 1 tablet by mouth 2 (two) times daily. One po bid x 7 days 14 tablet Noe Gens, [place]     Controlled Substance Prescriptions [place] Controlled Substance Registry consulted? Not Applicable   Noe Gens, PA-C 12/06/18 1051

## 2018-12-06 NOTE — ED Triage Notes (Signed)
Patient has had a cold for about one week, most symptoms have resolved except sinus pain pressure, headache

## 2018-12-06 NOTE — Discharge Instructions (Signed)
You may take 500mg acetaminophen every 4-6 hours or in combination with ibuprofen 400-600mg every 6-8 hours as needed for pain, inflammation, and fever. ° °Be sure to well hydrated with clear liquids and get at least 8 hours of sleep at night, preferably more while sick.  ° °Please follow up with family medicine in 1 week if needed. ° °Please take antibiotics as prescribed and be sure to complete entire course even if you start to feel better to ensure infection does not come back. ° °

## 2019-01-10 ENCOUNTER — Other Ambulatory Visit: Payer: Self-pay

## 2019-01-10 MED ORDER — AMLODIPINE BESYLATE 5 MG PO TABS: 5.0000 mg | ORAL_TABLET | Freq: Every day | ORAL | 0 refills | Status: DC

## 2019-01-10 NOTE — Progress Notes (Signed)
She takes both. The refill period is just out of sync. Okay to refill

## 2019-01-10 NOTE — Progress Notes (Signed)
Refill sent -EH/RMA

## 2019-02-18 ENCOUNTER — Telehealth: Payer: Self-pay | Admitting: Physician Assistant

## 2019-02-18 NOTE — Telephone Encounter (Signed)
Approvedtoday (Olmesartan) Effective from 02/18/2019 through 02/16/2022. Pharmacy aware.

## 2019-03-02 ENCOUNTER — Ambulatory Visit: Payer: BLUE CROSS/BLUE SHIELD | Admitting: Physician Assistant

## 2019-03-09 ENCOUNTER — Encounter: Payer: Self-pay | Admitting: Physician Assistant

## 2019-03-09 ENCOUNTER — Other Ambulatory Visit: Payer: Self-pay

## 2019-03-09 ENCOUNTER — Telehealth (INDEPENDENT_AMBULATORY_CARE_PROVIDER_SITE_OTHER): Payer: BLUE CROSS/BLUE SHIELD | Admitting: Physician Assistant

## 2019-03-09 DIAGNOSIS — Z9109 Other allergy status, other than to drugs and biological substances: Secondary | ICD-10-CM

## 2019-03-09 DIAGNOSIS — J4520 Mild intermittent asthma, uncomplicated: Secondary | ICD-10-CM

## 2019-03-09 DIAGNOSIS — I10 Essential (primary) hypertension: Secondary | ICD-10-CM | POA: Diagnosis not present

## 2019-03-09 HISTORY — DX: Other allergy status, other than to drugs and biological substances: Z91.09

## 2019-03-09 NOTE — Progress Notes (Signed)
Virtual Visit via Video Note  I connected with Ashley Roberts on 03/09/19 at  8:50 AM EDT by a video enabled telemedicine application and verified that I am speaking with the correct person using two identifiers.   I discussed the limitations of evaluation and management by telemedicine and the availability of in person appointments. The patient expressed understanding and agreed to proceed.  History of Present Illness:  Asthma:  Reports slightly increased Albuterol use in the last month with the increase in pollen. Still using inhaler less than once per day. Denies nocturnal cough/wheezing. No asthma exacerbations in the last year. Taking Zyrtec for seasonal allergies, which she states works well for her. Trying to exercise daily, taking a brisk walk for 15-20 minutes.  HTN: taking Benicar and Amlodipine daily. Compliant with medications. Does not checks BP's at home. Denies vision change, headache, chest pain with exertion, orthopnea, lightheadedness, syncope and edema. Risk factors include:    Past Medical History:  Diagnosis Date  . Allergy   . Hypertension   . Obesity   . Uterine fibroid    Past Surgical History:  Procedure Laterality Date  . ABDOMINAL HYSTERECTOMY     Social History   Tobacco Use  . Smoking status: Never Smoker  . Smokeless tobacco: Never Used  Substance Use Topics  . Alcohol use: Yes   family history includes Asthma in her cousin; Breast cancer in her maternal aunt; Cancer in her maternal uncle; Diabetes in her maternal aunt, maternal aunt, maternal aunt, maternal grandfather, and maternal grandmother; Heart attack in her maternal grandmother; Hypertension in her mother; Prostate cancer in her maternal grandfather; Renal Disease in her maternal aunt.    ROS: negative except as noted in the HPI  Medications: Current Outpatient Medications  Medication Sig Dispense Refill  . albuterol (PROVENTIL HFA;VENTOLIN HFA) 108 (90 Base) MCG/ACT inhaler Inhale 1-2  puffs into the lungs every 4 (four) hours as needed for wheezing or shortness of breath (chest tightness, bronchospasm). 1 Inhaler 3  . amLODipine (NORVASC) 5 MG tablet Take 1 tablet (5 mg total) by mouth daily. 90 tablet 0  . fexofenadine (ALLEGRA) 180 MG tablet Take 180 mg by mouth daily.    Marland Kitchen olmesartan-hydrochlorothiazide (BENICAR HCT) 40-25 MG tablet Take 1 tablet by mouth daily. 90 tablet 1   No current facility-administered medications for this visit.    No Known Allergies     Objective:  There were no vitals taken for this visit. Gen:  alert, not ill-appearing, no distress, appropriate for age 22: head normocephalic without obvious abnormality, conjunctiva and cornea clear, trachea midline Pulm: Normal work of breathing, normal phonation, speaking in full sentences Neuro: alert and oriented x 3  BP Readings from Last 3 Encounters:  12/06/18 (!) 129/91  09/01/18 133/78  08/24/18 118/77     Lab Results  Component Value Date   CREATININE 0.93 08/30/2018   BUN 10 08/30/2018   NA 139 08/30/2018   K 3.7 08/30/2018   CL 105 08/30/2018   CO2 28 08/30/2018   Lab Results  Component Value Date   ALT 10 08/30/2018   AST 13 08/30/2018   BILITOT 0.4 08/30/2018   Lab Results  Component Value Date   HGBA1C 5.6 09/01/2018   Lab Results  Component Value Date   CHOL 174 08/30/2018   HDL 33 (L) 08/30/2018   LDLCALC 121 (H) 08/30/2018   TRIG 94 08/30/2018   CHOLHDL 5.3 (H) 08/30/2018   The 10-year ASCVD risk score (Goff [place]  Brooke Bonito., et al., 2013) is: 5.8%   Values used to calculate the score:     Age: 61 years     Sex: Female     Is Non-Hispanic African American: Yes     Diabetic: No     Tobacco smoker: No     Systolic Blood Pressure: 841 mmHg     Is BP treated: Yes     HDL Cholesterol: 33 mg/dL     Total Cholesterol: 174 mg/dL   Assessment and Plan: 48 y.o. female with   .Diagnoses and all orders for this visit:  Mild intermittent reactive airway disease  without complication  Environmental allergies  Hypertension goal BP (blood pressure) < 130/80   Asthma is well-controlled. No indication for controller medication at this time. Cont Albuterol 1-2 puff Q4H prn  HTN - she does not own a BP cuff and is unable to self-monitor. Recommend she purchase automated home BP cuff. Return in 5 months for CPE w/fasting labs and BP follow-up   Follow Up Instructions:    I discussed the assessment and treatment plan with the patient. The patient was provided an opportunity to ask questions and all were answered. The patient agreed with the plan and demonstrated an understanding of the instructions.   The patient was advised to call back or seek an in-person evaluation if the symptoms worsen or if the condition fails to improve as anticipated.  I provided 10 minutes of non-face-to-face time during this encounter.   Trixie Dredge, [place]

## 2019-04-26 ENCOUNTER — Other Ambulatory Visit: Payer: Self-pay | Admitting: Physician Assistant

## 2019-05-26 ENCOUNTER — Other Ambulatory Visit: Payer: Self-pay | Admitting: Physician Assistant

## 2019-05-27 ENCOUNTER — Other Ambulatory Visit: Payer: Self-pay

## 2019-05-27 MED ORDER — OLMESARTAN MEDOXOMIL-HCTZ 40-25 MG PO TABS: 1.0000 | ORAL_TABLET | Freq: Every day | ORAL | 0 refills | Status: DC

## 2019-07-01 ENCOUNTER — Ambulatory Visit (INDEPENDENT_AMBULATORY_CARE_PROVIDER_SITE_OTHER): Payer: BC Managed Care – PPO | Admitting: Physician Assistant

## 2019-07-01 ENCOUNTER — Other Ambulatory Visit: Payer: Self-pay

## 2019-07-01 ENCOUNTER — Encounter: Payer: Self-pay | Admitting: Physician Assistant

## 2019-07-01 VITALS — BP 124/82 | HR 68 | Temp 98.6°F | Wt 223.0 lb

## 2019-07-01 DIAGNOSIS — R079 Chest pain, unspecified: Secondary | ICD-10-CM | POA: Diagnosis not present

## 2019-07-01 DIAGNOSIS — I10 Essential (primary) hypertension: Secondary | ICD-10-CM | POA: Diagnosis not present

## 2019-07-01 DIAGNOSIS — E785 Hyperlipidemia, unspecified: Secondary | ICD-10-CM | POA: Diagnosis not present

## 2019-07-01 DIAGNOSIS — Z9189 Other specified personal risk factors, not elsewhere classified: Secondary | ICD-10-CM

## 2019-07-01 DIAGNOSIS — Z8249 Family history of ischemic heart disease and other diseases of the circulatory system: Secondary | ICD-10-CM

## 2019-07-01 MED ORDER — ATORVASTATIN CALCIUM 10 MG PO TABS: 10.0000 mg | ORAL_TABLET | Freq: Every day | ORAL | 0 refills | Status: DC

## 2019-07-01 MED ORDER — AMLODIPINE BESYLATE 5 MG PO TABS: 5.0000 mg | ORAL_TABLET | Freq: Every day | ORAL | 0 refills | Status: DC

## 2019-07-01 MED ORDER — OLMESARTAN MEDOXOMIL-HCTZ 40-25 MG PO TABS: 1.0000 | ORAL_TABLET | Freq: Every day | ORAL | 0 refills | Status: DC

## 2019-07-01 NOTE — Progress Notes (Signed)
Virtual Visit via Telephone Note  I connected with [name] on 07/01/19 at 10:50 AM EDT by telephone and verified that I am speaking with the correct person using two identifiers.   I discussed the limitations, risks, security and privacy concerns of performing an evaluation and management service by telephone and the availability of in person appointments. I also discussed with the patient that there may be a patient responsible charge related to this service. The patient expressed understanding and agreed to proceed.   History of Present Illness: HPI:                                                                Ashley Roberts is a 48 y.o. female   CC: medication refills  HTN: longstanding history for 20+ years. Taking Olmesartan-HCTZ and Amlodipine daily. BP range 120's-141/80's-92. Compliant with medications.  She has never had a sleep study. She denies habitual snoring, witnessed apnea or hypersomnia.  She states she gets an intermittent, dull achey chest pain with walking 3-4 blocks in her neighborhood. Pain is rated as mild and improves with rest. Sometimes she is able to walk without chest pain. Denies vision change, headache, orthopnea, lightheadedness, syncope and edema. Risk factors include: family hx, obesity, dyslipidemia  She states there has been some anxiety and increased stress at work. Reports 20 yo cousin had a heart attack and died last month and this has made her very worried about her health.   Depression screen [facility] 2/9 07/01/2019 08/24/2018  Decreased Interest 0 0  Down, Depressed, Hopeless 0 0  PHQ - 2 Score 0 0  Altered sleeping 1 0  Tired, decreased energy 0 1  Change in appetite 0 0  Feeling bad or failure about yourself  0 0  Trouble concentrating 1 0  Moving slowly or fidgety/restless 0 0  Suicidal thoughts 0 0  PHQ-9 Score 2 1    GAD 7 : Generalized Anxiety Score 07/01/2019 08/24/2018  Nervous, Anxious, on Edge 1 0  Control/stop worrying 1 0   Worry too much - different things 1 0  Trouble relaxing 1 1  Restless 0 0  Easily annoyed or irritable 1 0  Afraid - awful might happen 1 0  Total GAD 7 Score 6 1      Past Medical History:  Diagnosis Date  . Allergy   . Hypertension   . Obesity   . Uterine fibroid    Past Surgical History:  Procedure Laterality Date  . ABDOMINAL HYSTERECTOMY     Social History   Tobacco Use  . Smoking status: Never Smoker  . Smokeless tobacco: Never Used  Substance Use Topics  . Alcohol use: Yes   family history includes Asthma in her cousin; Breast cancer in her maternal aunt; Cancer in her maternal uncle; Diabetes in her maternal aunt, maternal aunt, maternal aunt, maternal grandfather, and maternal grandmother; Heart attack in her maternal grandmother; Hypertension in her mother; Prostate cancer in her maternal grandfather; Renal Disease in her maternal aunt.    ROS: negative except as noted in the HPI  Medications: Current Outpatient Medications  Medication Sig Dispense Refill  . albuterol (PROVENTIL HFA;VENTOLIN HFA) 108 (90 Base) MCG/ACT inhaler Inhale 1-2 puffs into the lungs every 4 (four) hours as  needed for wheezing or shortness of breath (chest tightness, bronchospasm). 1 Inhaler 3  . amLODipine (NORVASC) 5 MG tablet Take 1 tablet (5 mg total) by mouth daily. Due for follow up visit w/PCP 15 tablet 0  . cetirizine (ZYRTEC) 10 MG tablet Take 10 mg by mouth daily.    Marland Kitchen olmesartan-hydrochlorothiazide (BENICAR HCT) 40-25 MG tablet Take 1 tablet by mouth daily. 90 tablet 0   No current facility-administered medications for this visit.    No Known Allergies     Objective:  BP 124/82   Pulse 68   Temp 98.6 F (37 C) (Oral)   Wt 223 lb (101.2 kg)   BMI 32.93 kg/m  Wt Readings from Last 3 Encounters:  07/01/19 223 lb (101.2 kg)  12/06/18 230 lb (104.3 kg)  09/01/18 232 lb (105.2 kg)   Temp Readings from Last 3 Encounters:  07/01/19 98.6 F (37 C) (Oral)  12/06/18  98.3 F (36.8 C) (Oral)  08/19/18 98.9 F (37.2 C) (Oral)   BP Readings from Last 3 Encounters:  07/01/19 124/82  12/06/18 (!) 129/91  09/01/18 133/78   Pulse Readings from Last 3 Encounters:  07/01/19 68  12/06/18 85  09/01/18 75    Gen:  alert, not ill-appearing, no distress, appropriate for age, obese female HEENT: head normocephalic without obvious abnormality, conjunctiva and cornea clear, trachea midline Pulm: Normal work of breathing, normal phonation Neuro: alert and oriented x 3 Psych: cooperative, euthymic mood, affect mood-congruent, speech is articulate, normal rate and volume; thought processes clear and goal-directed, normal judgment, good insight   Lab Results  Component Value Date   CREATININE 0.93 08/30/2018   BUN 10 08/30/2018   NA 139 08/30/2018   K 3.7 08/30/2018   CL 105 08/30/2018   CO2 28 08/30/2018   Lab Results  Component Value Date   ALT 10 08/30/2018   AST 13 08/30/2018   BILITOT 0.4 08/30/2018   Lab Results  Component Value Date   CHOL 174 08/30/2018   HDL 33 (L) 08/30/2018   LDLCALC 121 (H) 08/30/2018   TRIG 94 08/30/2018   CHOLHDL 5.3 (H) 08/30/2018   No results found for: WBC, HGB, HCT, MCV, PLT The 10-year ASCVD risk score Mikey Bussing DC Jr., et al., 2013) is: 5%   Values used to calculate the score:     Age: 62 years     Sex: Female     Is Non-Hispanic African American: Yes     Diabetic: No     Tobacco smoker: No     Systolic Blood Pressure: 102 mmHg     Is BP treated: Yes     HDL Cholesterol: 33 mg/dL     Total Cholesterol: 174 mg/dL    No results found for this or any previous visit (from the past 72 hour(s)). No results found.    Assessment and Plan: 48 y.o. female with   .[name] was seen today for medication management.  Diagnoses and all orders for this visit:  Hypertension goal BP (blood pressure) < 130/80 -     amLODipine (NORVASC) 5 MG tablet; Take 1 tablet (5 mg total) by mouth daily. -      olmesartan-hydrochlorothiazide (BENICAR HCT) 40-25 MG tablet; Take 1 tablet by mouth daily.  Exertional chest pain -     Ambulatory referral to Cardiology  At risk for cardiovascular event -     atorvastatin (LIPITOR) 10 MG tablet; Take 1 tablet (10 mg total) by mouth daily.  Dyslipidemia, goal LDL  below 100 -     atorvastatin (LIPITOR) 10 MG tablet; Take 1 tablet (10 mg total) by mouth daily.   HTN Home BP nearly at goal, DBP slightly above 80 Cont current medications Due for renal function and fasting labs in September  Exertional chest pain Physical exam limited by telephone visit today Concerning for angina 10 yr ASCVD risk 5% Family hx of heart disease Cont baby asa and statin for primary prevention Referral to Cardiology for further eval to include stress testing Advised her to avoid exertional activity apart from ADL's until she is evaluated  Keep follow-up appt in September to include fasting labs   Follow Up Instructions:    I discussed the assessment and treatment plan with the patient. The patient was provided an opportunity to ask questions and all were answered. The patient agreed with the plan and demonstrated an understanding of the instructions.   The patient was advised to call back or seek an in-person evaluation if the symptoms worsen or if the condition fails to improve as anticipated.  I provided 11-20 minutes of non-face-to-face time during this encounter.   Trixie Dredge, [place]

## 2019-07-06 ENCOUNTER — Encounter: Payer: Self-pay | Admitting: *Deleted

## 2019-07-07 ENCOUNTER — Other Ambulatory Visit: Payer: Self-pay

## 2019-07-07 ENCOUNTER — Ambulatory Visit (INDEPENDENT_AMBULATORY_CARE_PROVIDER_SITE_OTHER): Payer: BC Managed Care – PPO | Admitting: Cardiology

## 2019-07-07 ENCOUNTER — Encounter: Payer: Self-pay | Admitting: Cardiology

## 2019-07-07 VITALS — BP 128/74 | HR 106 | Ht 69.0 in | Wt 224.0 lb

## 2019-07-07 DIAGNOSIS — R0789 Other chest pain: Secondary | ICD-10-CM

## 2019-07-07 DIAGNOSIS — E785 Hyperlipidemia, unspecified: Secondary | ICD-10-CM | POA: Diagnosis not present

## 2019-07-07 DIAGNOSIS — I10 Essential (primary) hypertension: Secondary | ICD-10-CM

## 2019-07-07 DIAGNOSIS — R079 Chest pain, unspecified: Secondary | ICD-10-CM | POA: Diagnosis not present

## 2019-07-07 NOTE — Progress Notes (Signed)
Cardiology Office Note:    Date:  07/07/2019   ID:  Ashley Roberts, DOB Jan 17, 1971, MRN [id number]  PCP:  Trixie Dredge, PA-C  Cardiologist:  Jenean Lindau, MD   Referring MD: Ottis Stain*    ASSESSMENT:    1. Chest discomfort   2. Hypertension goal BP (blood pressure) < 130/80   3. Dyslipidemia    PLAN:    In order of problems listed above:  1. Chest discomfort: Primary prevention stressed with the patient.  Importance of compliance with diet and medication stressed and she vocalized understanding.  She has put a hold on exercise until the stress test is done and I agree with her primary care physician about it.  Diet was discussed and also weight reduction was stressed 2. Essential hypertension: Blood pressure is stable at this point.  Echocardiogram will be done to assess murmur heard on auscultation 3. Dyslipidemia: Managed by primary care physician and she is on statin therapy.  Diet was discussed. 4. Patient will be seen in follow-up appointment in 4 months or earlier if the patient has any concerns    Medication Adjustments/Labs and Tests Ordered: Current medicines are reviewed at length with the patient today.  Concerns regarding medicines are outlined above.  No orders of the defined types were placed in this encounter.  No orders of the defined types were placed in this encounter.    History of Present Illness:    Ashley Roberts is a 48 y.o. female who is being seen today for the evaluation of chest discomfort at the request of Ottis Stain*.  Patient is a pleasant 48 year old female.  She has past medical history of essential hypertension and dyslipidemia and is overweight.  She leads a sedentary lifestyle.  Recently she walking about 2 miles a day without any problems.  She mentions to me that she has her home on the third floor insulin when she goes up to the third floor she has some fast heartbeat.  No orthopnea PND she has  some shortness of breath with this.  No chest discomfort.  Her chest discomfort comes sporadically.  At the time of my evaluation, the patient is alert awake oriented and in no distress.  Past Medical History:  Diagnosis Date  . Allergy   . Dyslipidemia 08/31/2018  . Dyspnea on exertion 08/25/2018  . Environmental allergies 03/09/2019  . Expiratory wheezing on left side of chest 08/25/2018  . History of hysterectomy for benign disease 08/25/2018  . Hyperglycemia 08/31/2018  . Hypertension   . Hypertension goal BP (blood pressure) < 130/80 08/24/2018  . Obesity   . Reactive airway disease 09/01/2018  . Uterine fibroid     Past Surgical History:  Procedure Laterality Date  . ABDOMINAL HYSTERECTOMY      Current Medications: Current Meds  Medication Sig  . albuterol (PROVENTIL HFA;VENTOLIN HFA) 108 (90 Base) MCG/ACT inhaler Inhale 1-2 puffs into the lungs every 4 (four) hours as needed for wheezing or shortness of breath (chest tightness, bronchospasm).  Marland Kitchen amLODipine (NORVASC) 5 MG tablet Take 1 tablet (5 mg total) by mouth daily.  Marland Kitchen aspirin EC 81 MG tablet Take 81 mg by mouth daily.  Marland Kitchen atorvastatin (LIPITOR) 10 MG tablet Take 1 tablet (10 mg total) by mouth daily.  . cetirizine (ZYRTEC) 10 MG tablet Take 10 mg by mouth daily.  Marland Kitchen olmesartan-hydrochlorothiazide (BENICAR HCT) 40-25 MG tablet Take 1 tablet by mouth daily.     Allergies:   Patient  has no known allergies.   Social History   Socioeconomic History  . Marital status: Single    Spouse name: Not on file  . Number of children: Not on file  . Years of education: Not on file  . Highest education level: Not on file  Occupational History  . Not on file  Social Needs  . Financial resource strain: Not on file  . Food insecurity    Worry: Not on file    Inability: Not on file  . Transportation needs    Medical: Not on file    Non-medical: Not on file  Tobacco Use  . Smoking status: Former Research scientist (life sciences)  . Smokeless tobacco: Never  Used  Substance and Sexual Activity  . Alcohol use: Yes  . Drug use: Never  . Sexual activity: Not Currently    Birth control/protection: Abstinence  Lifestyle  . Physical activity    Days per week: Not on file    Minutes per session: Not on file  . Stress: Not on file  Relationships  . Social Herbalist on phone: Not on file    Gets together: Not on file    Attends religious service: Not on file    Active member of club or organization: Not on file    Attends meetings of clubs or organizations: Not on file    Relationship status: Not on file  Other Topics Concern  . Not on file  Social History Narrative  . Not on file     Family History: The patient's family history includes Asthma in her cousin; Breast cancer in her maternal aunt; Cancer in her maternal uncle; Diabetes in her maternal aunt, maternal aunt, maternal aunt, maternal grandfather, and maternal grandmother; Heart attack in her cousin and maternal grandmother; Hypertension in her mother; Prostate cancer in her maternal grandfather; Renal Disease in her maternal aunt.  ROS:   Please see the history of present illness.    All other systems reviewed and are negative.  EKGs/Labs/Other Studies Reviewed:    The following studies were reviewed today: EKG reveals sinus rhythm and nonspecific ST-T changes.   Recent Labs: 08/30/2018: ALT 10; BUN 10; Creat 0.93; Potassium 3.7; Sodium 139  Recent Lipid Panel    Component Value Date/Time   CHOL 174 08/30/2018 0824   TRIG 94 08/30/2018 0824   HDL 33 (L) 08/30/2018 0824   CHOLHDL 5.3 (H) 08/30/2018 0824   LDLCALC 121 (H) 08/30/2018 0824    Physical Exam:    VS:  BP 128/74   Pulse (!) 106   Ht 5\' 9"  (1.753 m)   Wt 224 lb (101.6 kg)   SpO2 100%   BMI 33.08 kg/m     Wt Readings from Last 3 Encounters:  07/07/19 224 lb (101.6 kg)  07/01/19 223 lb (101.2 kg)  12/06/18 230 lb (104.3 kg)     GEN: Patient is in no acute distress HEENT: Normal NECK: No  JVD; No carotid bruits LYMPHATICS: No lymphadenopathy CARDIAC: S1 S2 regular, 2/6 systolic murmur at the apex. RESPIRATORY:  Clear to auscultation without rales, wheezing or rhonchi  ABDOMEN: Soft, non-tender, non-distended MUSCULOSKELETAL:  No edema; No deformity  SKIN: Warm and dry NEUROLOGIC:  Alert and oriented x 3 PSYCHIATRIC:  Normal affect    Signed, Jenean Lindau, MD  07/07/2019 10:23 AM    [place]

## 2019-07-07 NOTE — Patient Instructions (Signed)
Medication Instructions:  Your physician recommends that you continue on your current medications as directed. Please refer to the Current Medication list given to you today.  If you need a refill on your cardiac medications before your next appointment, please call your pharmacy.   Lab work: NONE If you have labs (blood work) drawn today and your tests are completely normal, you will receive your results only by: . MyChart Message (if you have MyChart) OR . A paper copy in the mail If you have any lab test that is abnormal or we need to change your treatment, we will call you to review the results.  Testing/Procedures: You had an EKG performed today.  Your physician has requested that you have an echocardiogram. Echocardiography is a painless test that uses sound waves to create images of your heart. It provides your doctor with information about the size and shape of your heart and how well your heart's chambers and valves are working. This procedure takes approximately one hour. There are no restrictions for this procedure.  Your physician has requested that you have a lexiscan myoview. For further information please visit [email or web address]. Please follow instruction sheet, as given.    Follow-Up: At [facility], you and your health needs are our priority.  As part of our continuing mission to provide you with exceptional heart care, we have created designated Provider Care Teams.  These Care Teams include your primary Cardiologist (physician) and Advanced Practice Providers (APPs -  Physician Assistants and Nurse Practitioners) who all work together to provide you with the care you need, when you need it. You will need a follow up appointment in 4 months.   Any Other Special Instructions Will Be Listed Below  Regadenoson injection What is this medicine? REGADENOSON is used to test the heart for coronary artery disease. It is used in patients who can not exercise for their stress  test. This medicine may be used for other purposes; ask your health care provider or pharmacist if you have questions. COMMON BRAND NAME(S): Lexiscan What should I tell my health care provider before I take this medicine? They need to know if you have any of these conditions:  heart problems  lung or breathing disease, like asthma or COPD  an unusual or allergic reaction to regadenoson, other medicines, foods, dyes, or preservatives  pregnant or trying to get pregnant  breast-feeding How should I use this medicine? This medicine is for injection into a vein. It is given by a health care professional in a hospital or clinic setting. Talk to your pediatrician regarding the use of this medicine in children. Special care may be needed. Overdosage: If you think you have taken too much of this medicine contact a poison control center or emergency room at once. NOTE: This medicine is only for you. Do not share this medicine with others. What if I miss a dose? This does not apply. What may interact with this medicine?  caffeine  dipyridamole  guarana  theophylline This list may not describe all possible interactions. Give your health care provider a list of all the medicines, herbs, non-prescription drugs, or dietary supplements you use. Also tell them if you smoke, drink alcohol, or use illegal drugs. Some items may interact with your medicine. What should I watch for while using this medicine? Your condition will be monitored carefully while you are receiving this medicine. Do not take medicines, foods, or drinks with caffeine (like coffee, tea, or colas) for at least 12 hours   before your test. If you do not know if something contains caffeine, ask your health care professional. What side effects may I notice from receiving this medicine? Side effects that you should report to your doctor or health care professional as soon as possible:  allergic reactions like skin rash, itching or  hives, swelling of the face, lips, or tongue  breathing problems  chest pain, tightness or palpitations  severe headache Side effects that usually do not require medical attention (report to your doctor or health care professional if they continue or are bothersome):  flushing  headache  irritation or pain at site where injected  nausea, vomiting This list may not describe all possible side effects. Call your doctor for medical advice about side effects. You may report side effects to FDA at 1-800-FDA-1088. Where should I keep my medicine? This drug is given in a hospital or clinic and will not be stored at home. NOTE: This sheet is a summary. It may not cover all possible information. If you have questions about this medicine, talk to your doctor, pharmacist, or health care provider.  2020 Elsevier/Gold Standard (2008-07-17 15:08:13)  Cardiac Nuclear Scan A cardiac nuclear scan is a test that is done to check the flow of blood to your heart. It is done when you are resting and when you are exercising. The test looks for problems such as:  Not enough blood reaching a portion of the heart.  The heart muscle not working as it should. You may need this test if:  You have heart disease.  You have had lab results that are not normal.  You have had heart surgery or a balloon procedure to open up blocked arteries (angioplasty).  You have chest pain.  You have shortness of breath. In this test, a special dye (tracer) is put into your bloodstream. The tracer will travel to your heart. A camera will then take pictures of your heart to see how the tracer moves through your heart. This test is usually done at a hospital and takes 2-4 hours. Tell a doctor about:  Any allergies you have.  All medicines you are taking, including vitamins, herbs, eye drops, creams, and over-the-counter medicines.  Any problems you or family members have had with anesthetic medicines.  Any blood  disorders you have.  Any surgeries you have had.  Any medical conditions you have.  Whether you are pregnant or may be pregnant. What are the risks? Generally, this is a safe test. However, problems may occur, such as:  Serious chest pain and heart attack. This is only a risk if the stress portion of the test is done.  Rapid heartbeat.  A feeling of warmth in your chest. This feeling usually does not last long.  Allergic reaction to the tracer. What happens before the test?  Ask your doctor about changing or stopping your normal medicines. This is important.  Follow instructions from your doctor about what you cannot eat or drink.  Remove your jewelry on the day of the test. What happens during the test?  An IV tube will be inserted into one of your veins.  Your doctor will give you a small amount of tracer through the IV tube.  You will wait for 20-40 minutes while the tracer moves through your bloodstream.  Your heart will be monitored with an electrocardiogram (ECG).  You will lie down on an exam table.  Pictures of your heart will be taken for about 15-20 minutes.  You may   also have a stress test. For this test, one of these things may be done: ? You will be asked to exercise on a treadmill or a stationary bike. ? You will be given medicines that will make your heart work harder. This is done if you are unable to exercise.  When blood flow to your heart has peaked, a tracer will again be given through the IV tube.  After 20-40 minutes, you will get back on the exam table. More pictures will be taken of your heart.  Depending on the tracer that is used, more pictures may need to be taken 3-4 hours later.  Your IV tube will be removed when the test is over. The test may vary among doctors and hospitals. What happens after the test?  Ask your doctor: ? Whether you can return to your normal schedule, including diet, activities, and medicines. ? Whether you should  drink more fluids. This will help to remove the tracer from your body. Drink enough fluid to keep your pee (urine) pale yellow.  Ask your doctor, or the department that is doing the test: ? When will my results be ready? ? How will I get my results? Summary  A cardiac nuclear scan is a test that is done to check the flow of blood to your heart.  Tell your doctor whether you are pregnant or may be pregnant.  Before the test, ask your doctor about changing or stopping your normal medicines. This is important.  Ask your doctor whether you can return to your normal activities. You may be asked to drink more fluids. This information is not intended to replace advice given to you by your health care provider. Make sure you discuss any questions you have with your health care provider. Document Released: 05/03/2018 Document Revised: 03/09/2019 Document Reviewed: 05/03/2018 Elsevier Patient Education  2020 Elsevier Inc.  Echocardiogram An echocardiogram is a procedure that uses painless sound waves (ultrasound) to produce an image of the heart. Images from an echocardiogram can provide important information about:  Signs of coronary artery disease (CAD).  Aneurysm detection. An aneurysm is a weak or damaged part of an artery wall that bulges out from the normal force of blood pumping through the body.  Heart size and shape. Changes in the size or shape of the heart can be associated with certain conditions, including heart failure, aneurysm, and CAD.  Heart muscle function.  Heart valve function.  Signs of a past heart attack.  Fluid buildup around the heart.  Thickening of the heart muscle.  A tumor or infectious growth around the heart valves. Tell a health care provider about:  Any allergies you have.  All medicines you are taking, including vitamins, herbs, eye drops, creams, and over-the-counter medicines.  Any blood disorders you have.  Any surgeries you have had.  Any  medical conditions you have.  Whether you are pregnant or may be pregnant. What are the risks? Generally, this is a safe procedure. However, problems may occur, including:  Allergic reaction to dye (contrast) that may be used during the procedure. What happens before the procedure? No specific preparation is needed. You may eat and drink normally. What happens during the procedure?   An IV tube may be inserted into one of your veins.  You may receive contrast through this tube. A contrast is an injection that improves the quality of the pictures from your heart.  A gel will be applied to your chest.  A wand-like tool (transducer)   will be moved over your chest. The gel will help to transmit the sound waves from the transducer.  The sound waves will harmlessly bounce off of your heart to allow the heart images to be captured in real-time motion. The images will be recorded on a computer. The procedure may vary among health care providers and hospitals. What happens after the procedure?  You may return to your normal, everyday life, including diet, activities, and medicines, unless your health care provider tells you not to do that. Summary  An echocardiogram is a procedure that uses painless sound waves (ultrasound) to produce an image of the heart.  Images from an echocardiogram can provide important information about the size and shape of your heart, heart muscle function, heart valve function, and fluid buildup around your heart.  You do not need to do anything to prepare before this procedure. You may eat and drink normally.  After the echocardiogram is completed, you may return to your normal, everyday life, unless your health care provider tells you not to do that. This information is not intended to replace advice given to you by your health care provider. Make sure you discuss any questions you have with your health care provider. Document Released: 11/14/2000 Document  Revised: 03/10/2019 Document Reviewed: 12/20/2016 Elsevier Patient Education  2020 Elsevier Inc.  

## 2019-07-07 NOTE — Addendum Note (Signed)
Addended by: Beckey Rutter on: 07/07/2019 10:46 AM   Modules accepted: Orders

## 2019-07-10 ENCOUNTER — Encounter: Payer: Self-pay | Admitting: Physician Assistant

## 2019-07-10 DIAGNOSIS — Z8249 Family history of ischemic heart disease and other diseases of the circulatory system: Secondary | ICD-10-CM | POA: Insufficient documentation

## 2019-07-10 DIAGNOSIS — R079 Chest pain, unspecified: Secondary | ICD-10-CM | POA: Insufficient documentation

## 2019-07-10 DIAGNOSIS — Z9189 Other specified personal risk factors, not elsewhere classified: Secondary | ICD-10-CM | POA: Insufficient documentation

## 2019-07-10 DIAGNOSIS — E785 Hyperlipidemia, unspecified: Secondary | ICD-10-CM | POA: Insufficient documentation

## 2019-07-12 ENCOUNTER — Telehealth (HOSPITAL_COMMUNITY): Payer: Self-pay

## 2019-07-12 NOTE — Telephone Encounter (Signed)
Instructions for the patient's nuclear stress test was left on her answering machine. Asked to call back with any questions. S.Ashley Roberts EMTP

## 2019-07-13 ENCOUNTER — Ambulatory Visit (HOSPITAL_BASED_OUTPATIENT_CLINIC_OR_DEPARTMENT_OTHER)
Admission: RE | Admit: 2019-07-13 | Discharge: 2019-07-13 | Disposition: A | Discharge status: Home / Self Care | Payer: BC Managed Care – PPO | Source: Ambulatory Visit | Attending: Cardiology | Admitting: Cardiology

## 2019-07-13 ENCOUNTER — Other Ambulatory Visit: Payer: Self-pay

## 2019-07-13 DIAGNOSIS — R0789 Other chest pain: Secondary | ICD-10-CM | POA: Diagnosis not present

## 2019-07-13 DIAGNOSIS — I10 Essential (primary) hypertension: Secondary | ICD-10-CM | POA: Diagnosis not present

## 2019-07-13 NOTE — Progress Notes (Signed)
  Echocardiogram 2D Echocardiogram has been performed.  Ashley Roberts 07/13/2019, 11:39 AM

## 2019-07-14 ENCOUNTER — Ambulatory Visit (HOSPITAL_COMMUNITY): Payer: BC Managed Care – PPO | Attending: Cardiovascular Disease

## 2019-07-14 VITALS — Ht 69.0 in | Wt 224.0 lb

## 2019-07-14 DIAGNOSIS — R079 Chest pain, unspecified: Secondary | ICD-10-CM | POA: Diagnosis not present

## 2019-07-14 LAB — MYOCARDIAL PERFUSION IMAGING
LV dias vol: 54 mL (ref 46–106)
LV sys vol: 15 mL
Peak HR: 123 {beats}/min
Rest HR: 85 {beats}/min
SDS: 4
SRS: 0
SSS: 4
TID: 1.05

## 2019-07-14 MED ORDER — REGADENOSON 0.4 MG/5ML IV SOLN
0.4000 mg | Freq: Once | INTRAVENOUS | Status: AC
  Administered 2019-07-14: 0.4 mg via INTRAVENOUS

## 2019-07-14 MED ORDER — TECHNETIUM TC 99M TETROFOSMIN IV KIT
32.1000 | PACK | Freq: Once | INTRAVENOUS | Status: AC | PRN
  Administered 2019-07-14: 32.1 via INTRAVENOUS
  Filled 2019-07-14: qty 33

## 2019-07-14 MED ORDER — TECHNETIUM TC 99M TETROFOSMIN IV KIT
10.5000 | PACK | Freq: Once | INTRAVENOUS | Status: AC | PRN
  Administered 2019-07-14: 10.5 via INTRAVENOUS
  Filled 2019-07-14: qty 11

## 2019-07-18 ENCOUNTER — Encounter: Payer: Self-pay | Admitting: *Deleted

## 2019-08-03 ENCOUNTER — Encounter: Payer: Self-pay | Admitting: Physician Assistant

## 2019-08-16 ENCOUNTER — Encounter: Payer: Self-pay | Admitting: Physician Assistant

## 2019-08-16 ENCOUNTER — Ambulatory Visit (INDEPENDENT_AMBULATORY_CARE_PROVIDER_SITE_OTHER): Payer: BC Managed Care – PPO | Admitting: Physician Assistant

## 2019-08-16 ENCOUNTER — Other Ambulatory Visit: Payer: Self-pay

## 2019-08-16 VITALS — BP 107/71 | HR 71 | Temp 98.1°F | Resp 18 | Wt 226.0 lb

## 2019-08-16 DIAGNOSIS — E66811 Other obesity due to excess calories: Secondary | ICD-10-CM

## 2019-08-16 DIAGNOSIS — Z9189 Other specified personal risk factors, not elsewhere classified: Secondary | ICD-10-CM

## 2019-08-16 DIAGNOSIS — I10 Essential (primary) hypertension: Secondary | ICD-10-CM

## 2019-08-16 DIAGNOSIS — E785 Hyperlipidemia, unspecified: Secondary | ICD-10-CM

## 2019-08-16 DIAGNOSIS — Z0000 Encounter for general adult medical examination without abnormal findings: Secondary | ICD-10-CM

## 2019-08-16 DIAGNOSIS — Z1211 Encounter for screening for malignant neoplasm of colon: Secondary | ICD-10-CM | POA: Diagnosis not present

## 2019-08-16 DIAGNOSIS — Z23 Encounter for immunization: Secondary | ICD-10-CM | POA: Diagnosis not present

## 2019-08-16 DIAGNOSIS — J4520 Mild intermittent asthma, uncomplicated: Secondary | ICD-10-CM

## 2019-08-16 DIAGNOSIS — E6609 Other obesity due to excess calories: Secondary | ICD-10-CM | POA: Insufficient documentation

## 2019-08-16 DIAGNOSIS — Z6833 Body mass index (BMI) 33.0-33.9, adult: Secondary | ICD-10-CM

## 2019-08-16 MED ORDER — ALBUTEROL SULFATE HFA 108 (90 BASE) MCG/ACT IN AERS: 1.0000 | INHALATION_SPRAY | RESPIRATORY_TRACT | 3 refills | Status: DC | PRN

## 2019-08-16 MED ORDER — AMLODIPINE BESYLATE 5 MG PO TABS: 5.0000 mg | ORAL_TABLET | Freq: Every day | ORAL | 1 refills | Status: DC

## 2019-08-16 MED ORDER — OLMESARTAN MEDOXOMIL-HCTZ 40-25 MG PO TABS: 1.0000 | ORAL_TABLET | Freq: Every day | ORAL | 1 refills | Status: DC

## 2019-08-16 MED ORDER — ATORVASTATIN CALCIUM 10 MG PO TABS: 10.0000 mg | ORAL_TABLET | Freq: Every day | ORAL | 1 refills | Status: DC

## 2019-08-16 NOTE — Progress Notes (Signed)
HPI:                                                                Ashley Roberts is a 48 y.o. female who presents to [facility] [place] today for CPE     She reports she has resumed a regular walking regimen as of last week. She is walking about 1 mile for 20 minutes. She reports she has noticed the chest discomfort is present by the end of her walk. She also notices shortness of breath after taking a long, hot shower and she does use her Albuterol inhaler with relief of SOB.  GYN/Sexual Health  Obstetrics: G1P1  Menstrual status: partial hysterectomy  Last pap smear: 2018, [place]  History of abnormal pap smears: never  Sexually active: not currently  Current contraception: surgical, abstinence  History of STI: never  Depression screen [facility] 2/9 08/16/2019 07/01/2019 08/24/2018  Decreased Interest 0 0 0  Down, Depressed, Hopeless 0 0 0  PHQ - 2 Score 0 0 0  Altered sleeping - 1 0  Tired, decreased energy - 0 1  Change in appetite - 0 0  Feeling bad or failure about yourself  - 0 0  Trouble concentrating - 1 0  Moving slowly or fidgety/restless - 0 0  Suicidal thoughts - 0 0  PHQ-9 Score - 2 1     Health Maintenance Health Maintenance  Topic Date Due  . HIV Screening  05/15/1986  . TETANUS/TDAP  08/25/2019 (Originally 05/15/1990)  . PAP SMEAR-Modifier  05/01/2022  . INFLUENZA VACCINE  Completed    Past Medical History:  Diagnosis Date  . Allergy   . Dyslipidemia 08/31/2018  . Dyspnea on exertion 08/25/2018  . Environmental allergies 03/09/2019  . Expiratory wheezing on left side of chest 08/25/2018  . History of hysterectomy for benign disease 08/25/2018  . Hyperglycemia 08/31/2018  . Hypertension   . Hypertension goal BP (blood pressure) < 130/80 08/24/2018  . Obesity   . Reactive airway disease 09/01/2018  . Uterine fibroid    Past Surgical History:  Procedure Laterality Date  . ABDOMINAL HYSTERECTOMY      Social History   Tobacco Use  . Smoking status: Never Smoker  . Smokeless tobacco: Never Used  Substance Use Topics  . Alcohol use: Not Currently   family history includes Asthma in her cousin; Breast cancer in her maternal aunt; Cancer in her maternal uncle; Diabetes in her maternal aunt, maternal aunt, maternal aunt, maternal grandfather, and maternal grandmother; Heart attack in her cousin and maternal grandmother; Hypertension in her brother and mother; Prostate cancer in her maternal grandfather; Renal Disease in her maternal aunt.  ROS: negative except as noted in the HPI  Medications: Current Outpatient Medications  Medication Sig Dispense Refill  . albuterol (VENTOLIN HFA) 108 (90 Base) MCG/ACT inhaler Inhale 1-2 puffs into the lungs every 4 (four) hours as needed for wheezing or shortness of breath (chest tightness, bronchospasm). 18 g 3  . amLODipine (NORVASC) 5 MG tablet Take 1 tablet (5 mg total) by mouth daily. 90 tablet 1  . aspirin EC 81 MG tablet Take 81 mg by mouth daily.    Marland Kitchen atorvastatin (LIPITOR) 10 MG tablet Take 1 tablet (10 mg  total) by mouth daily. 90 tablet 1  . cetirizine (ZYRTEC) 10 MG tablet Take 10 mg by mouth daily.    Marland Kitchen olmesartan-hydrochlorothiazide (BENICAR HCT) 40-25 MG tablet Take 1 tablet by mouth daily. 90 tablet 1   No current facility-administered medications for this visit.    No Known Allergies     Objective:  BP 107/71   Pulse 71   Temp 98.1 F (36.7 C) (Oral)   Resp 18   Wt 226 lb (102.5 kg)   SpO2 98%   BMI 33.37 kg/m   Wt Readings from Last 3 Encounters:  08/16/19 226 lb (102.5 kg)  07/14/19 224 lb (101.6 kg)  07/07/19 224 lb (101.6 kg)   Temp Readings from Last 3 Encounters:  08/16/19 98.1 F (36.7 C) (Oral)  07/01/19 98.6 F (37 C) (Oral)  12/06/18 98.3 F (36.8 C) (Oral)   BP Readings from Last 3 Encounters:  08/16/19 107/71  07/07/19 128/74  07/01/19 124/82   Pulse Readings from Last 3 Encounters:   08/16/19 71  07/07/19 (!) 106  07/01/19 68    General Appearance:  Alert, cooperative, no distress, appropriate for age, obese female                            Head:  Normocephalic, without obvious abnormality                             Eyes:  PERRL, EOM's intact, conjunctiva and cornea clear, wearing glasses                             Ears:  TM pearly gray color and semitransparent, external ear canals normal, both ears                            Nose:  Nares symmetrical, mucosa pink                          Throat:  Lips, tongue, and mucosa are moist, pink, and intact; oropharynx clear, uvula midline; poor dentition, multiple fillings and missing teeth                             Neck:  Supple; symmetrical, trachea midline, no adenopathy; thyroid: no enlargement, symmetric, no tenderness/mass/nodules                             Back:  Symmetrical, no curvature, ROM normal               Chest/Breast:  deferred                           Lungs:  Clear to auscultation bilaterally, poor air movement, no wheezes, rales or rhonchi respirations unlabored                             Heart:  normal rate & regular rhythm, S1 and S2 normal, no murmurs, rubs, or gallops                     Abdomen:  Soft, non-tender, no mass  or organomegaly              Genitourinary:  deferred          Musculoskeletal:  Tone and strength strong and symmetrical, all extremities; no joint pain or edema, normal gait and station                                    Lymphatic:  No adenopathy             Skin/Hair/Nails:  Skin warm, dry and intact, no rashes or abnormal dyspigmentation on limited exam                   Neurologic:  Alert and oriented x3, no cranial nerve deficits,  sensation grossly intact, normal gait and station, no tremor Psych: well-groomed, cooperative, good eye contact, euthymic mood, affect mood-congruent, speech is articulate, and thought processes clear and goal-directed  Lab Results  Component  Value Date   CREATININE 0.93 08/30/2018   BUN 10 08/30/2018   NA 139 08/30/2018   K 3.7 08/30/2018   CL 105 08/30/2018   CO2 28 08/30/2018   Lab Results  Component Value Date   ALT 10 08/30/2018   AST 13 08/30/2018   BILITOT 0.4 08/30/2018   No results found for: WBC, HGB, HCT, MCV, PLT      Assessment and Plan: 48 y.o. female with   .Stephane was seen today for annual exam.  Diagnoses and all orders for this visit:  Hypertension goal BP (blood pressure) < 130/80 -     CBC -     COMPLETE METABOLIC PANEL WITH GFR -     TSH + free T4 -     olmesartan-hydrochlorothiazide (BENICAR HCT) 40-25 MG tablet; Take 1 tablet by mouth daily. -     amLODipine (NORVASC) 5 MG tablet; Take 1 tablet (5 mg total) by mouth daily.  Dyslipidemia, goal LDL below 100 -     Lipid Profile -     atorvastatin (LIPITOR) 10 MG tablet; Take 1 tablet (10 mg total) by mouth daily.  Need for immunization against influenza -     Flu Vaccine QUAD 36+ mos IM  Colon cancer screening -     Ambulatory referral to Gastroenterology  Encounter for annual physical exam -     CBC -     COMPLETE METABOLIC PANEL WITH GFR -     Lipid Profile -     TSH + free T4  At risk for cardiovascular event -     atorvastatin (LIPITOR) 10 MG tablet; Take 1 tablet (10 mg total) by mouth daily.  Mild intermittent reactive airway disease without complication -     albuterol (VENTOLIN HFA) 108 (90 Base) MCG/ACT inhaler; Inhale 1-2 puffs into the lungs every 4 (four) hours as needed for wheezing or shortness of breath (chest tightness, bronchospasm).  Class 1 obesity due to excess calories with serious comorbidity and body mass index (BMI) of 33.0 to 33.9 in adult -     CBC -     COMPLETE METABOLIC PANEL WITH GFR -     Lipid Profile -     TSH + free T4  - Personally reviewed PMH, PSH, PFH, medications, allergies, HM - Age-appropriate cancer screening: due for mammo 09/2019 - Influenza given - Tdap declined - PHQ2  negative - Routine fasting labs pending    Patient education and anticipatory  guidance given Patient agrees with treatment plan Follow-up in 6 months for HTN/asthma or sooner as needed  Darlyne Russian PA-C

## 2019-08-16 NOTE — Patient Instructions (Signed)
Use Albuterol inhaler 2 puffs 15 minutes before exercise / activity Can still 1-2 puffs every 4-6 hrs as needed for SOB/wheezing   Exercise-Induced Bronchoconstriction, Adult  Exercise-induced bronchospasm (EIB) happens when the airways narrow during or after vigorous activity or exercise. The airways are the passages that lead from the nose and mouth down into the lungs. When the airways narrow, this can cause coughing, wheezing, and shortness of breath. This makes it hard to breathe. Anyone can develop EIB, even people who do not have allergies or asthma. With proper treatment, most people with EIB can be active and exercise normally. What are the causes? The exact cause of this condition is not known. Symptoms are brought on (triggered) by physical activity. EIB can also be triggered by:  Breathing very cold and dry or hot and humid air.  Chemicals, such as chlorine in swimming pools, pesticides, or fertilizers.  Outdoor triggers, such as: Geneticist, molecular pollution. ? Car exhaust. ? Pollen from grass, trees, or flowers. ? Campfire smoke.  Indoor triggers, such as: ? Dust. ? Mold. ? Tobacco smoke. ? Cleaning solutions. ? Animal dander. What increases the risk? You are more likely to develop this condition if you:  Have asthma.  Participate in sports that require constant motion, such as basketball, hockey, skiing, and swimming.  Work outdoors.  Exercise where there are higher levels of one or more EIB triggers. What are the signs or symptoms? Symptoms of this condition include:  Coughing.  Wheezing.  Shortness of breath.  Chest pain or tightness.  Sore throat.  Upset stomach. Symptoms may worsen after exercise has stopped. How is this diagnosed? EIB is diagnosed with your medical history and a physical exam. You may also have other tests, including:  Lung function studies (spirometry).  An exercise test to check for EIB symptoms.  Allergy tests. How is this  treated? Treatment for EIB includes preventing symptoms when possible, and treating EIB quickly when symptoms occur. Treatment may include:  Taking medicine that your health care provider prescribes. Medicine comes in different forms, including: ? Medicines that you breathe in (inhale). These include:  Steroids. These help to control your symptoms and are usually taken every day.  Quick relief medicines. These help to quickly relieve your breathing difficulty. ? Medicines that you take by mouth (orally). These help to control allergies and asthma.  Avoiding triggers.  Stopping physical activity or exercise to rest. Follow these instructions at home:  Take over-the-counter and prescription medicines only as told by your health care provider.  Do not use products that contain nicotine or tobacco, such as cigarettes, e-cigarettes, and chewing tobacco. If you need help quitting, ask your health care provider.  Make changes in your workout as told by your health care provider. Exercise is important to your health and well-being. ? Keep quick relief medicine with you when you are exercising. ? Tell your exercise partners about your condition. Wear a medical ID bracelet. ? If you are planning to exercise alone or in an isolated area, let someone know where you are going and when you will be back.  You may need to see a health care provider who specializes in allergies (allergist) or the lungs (pulmonologist) for more tests.  Keep all follow-up visits as told by your health care provider. This is important. How is this prevented?  Take medicines to prevent exercise-induced bronchospasm as told by your health care provider.  Warm up before starting to play sports or exercise.  Exercise indoors  to avoid outdoor triggers.  Cover your nose and mouth with a scarf to warm air that is very cold.  Tell your workout partners or trainer about your condition. Tell them how to help you if you have  an episode. Contact a health care provider if:  You have coughing, wheezing, or shortness of breath that continues after treatment.  Your coughing wakes you up at night.  You have less endurance than you used to. Get help right away if:  Your medicine is not helping you breathe better.  You cannot catch your breath.  You pass out. Summary  Exercise-induced bronchospasm (EIB) happens when the airways narrow during or after exercise.  When the airways narrow, this can cause coughing, wheezing, and shortness of breath. It can be difficult to breathe.  Take over-the-counter and prescription medicines only as told by your health care provider.  Make changes in your workout as told by your health care provider.  Contact a health care provider if you continue to have trouble breathing after treatment. This information is not intended to replace advice given to you by your health care provider. Make sure you discuss any questions you have with your health care provider. Document Released: 11/17/2005 Document Revised: 08/05/2018 Document Reviewed: 08/10/2018 Elsevier Patient Education  2020 [employer]   Preventive Care 27-62 Years Old, Female Preventive care refers to visits with your health care provider and lifestyle choices that can promote health and wellness. This includes:  A yearly physical exam. This may also be called an annual well check.  Regular dental visits and eye exams.  Immunizations.  Screening for certain conditions.  Healthy lifestyle choices, such as eating a healthy diet, getting regular exercise, not using drugs or products that contain nicotine and tobacco, and limiting alcohol use. What can I expect for my preventive care visit? Physical exam Your health care provider will check your:  Height and weight. This may be used to calculate body mass index (BMI), which tells if you are at a healthy weight.  Heart rate and blood pressure.  Skin for  abnormal spots. Counseling Your health care provider may ask you questions about your:  Alcohol, tobacco, and drug use.  Emotional well-being.  Home and relationship well-being.  Sexual activity.  Eating habits.  Work and work Statistician.  Method of birth control.  Menstrual cycle.  Pregnancy history. What immunizations do I need?  Influenza (flu) vaccine  This is recommended every year. Tetanus, diphtheria, and pertussis (Tdap) vaccine  You may need a Td booster every 10 years. Varicella (chickenpox) vaccine  You may need this if you have not been vaccinated. Zoster (shingles) vaccine  You may need this after age 76. Measles, mumps, and rubella (MMR) vaccine  You may need at least one dose of MMR if you were born in 1957 or later. You may also need a second dose. Pneumococcal conjugate (PCV13) vaccine  You may need this if you have certain conditions and were not previously vaccinated. Pneumococcal polysaccharide (PPSV23) vaccine  You may need one or two doses if you smoke cigarettes or if you have certain conditions. Meningococcal conjugate (MenACWY) vaccine  You may need this if you have certain conditions. Hepatitis A vaccine  You may need this if you have certain conditions or if you travel or work in places where you may be exposed to hepatitis A. Hepatitis B vaccine  You may need this if you have certain conditions or if you travel or work in places where  you may be exposed to hepatitis B. Haemophilus influenzae type b (Hib) vaccine  You may need this if you have certain conditions. Human papillomavirus (HPV) vaccine  If recommended by your health care provider, you may need three doses over 6 months. You may receive vaccines as individual doses or as more than one vaccine together in one shot (combination vaccines). Talk with your health care provider about the risks and benefits of combination vaccines. What tests do I need? Blood tests  Lipid  and cholesterol levels. These may be checked every 5 years, or more frequently if you are over 51 years old.  Hepatitis C test.  Hepatitis B test. Screening  Lung cancer screening. You may have this screening every year starting at age 81 if you have a 30-pack-year history of smoking and currently smoke or have quit within the past 15 years.  Colorectal cancer screening. All adults should have this screening starting at age 51 and continuing until age 21. Your health care provider may recommend screening at age 54 if you are at increased risk. You will have tests every 1-10 years, depending on your results and the type of screening test.  Diabetes screening. This is done by checking your blood sugar (glucose) after you have not eaten for a while (fasting). You may have this done every 1-3 years.  Mammogram. This may be done every 1-2 years. Talk with your health care provider about when you should start having regular mammograms. This may depend on whether you have a family history of breast cancer.  BRCA-related cancer screening. This may be done if you have a family history of breast, ovarian, tubal, or peritoneal cancers.  Pelvic exam and Pap test. This may be done every 3 years starting at age 29. Starting at age 68, this may be done every 5 years if you have a Pap test in combination with an HPV test. Other tests  Sexually transmitted disease (STD) testing.  Bone density scan. This is done to screen for osteoporosis. You may have this scan if you are at high risk for osteoporosis. Follow these instructions at home: Eating and drinking  Eat a diet that includes fresh fruits and vegetables, whole grains, lean protein, and low-fat dairy.  Take vitamin and mineral supplements as recommended by your health care provider.  Do not drink alcohol if: ? Your health care provider tells you not to drink. ? You are pregnant, may be pregnant, or are planning to become pregnant.  If you drink  alcohol: ? Limit how much you have to 0-1 drink a day. ? Be aware of how much alcohol is in your drink. In the [place] one drink equals one 12 oz bottle of beer (355 mL), one 5 oz glass of wine (148 mL), or one 1 oz glass of hard liquor (44 mL). Lifestyle  Take daily care of your teeth and gums.  Stay active. Exercise for at least 30 minutes on 5 or more days each week.  Do not use any products that contain nicotine or tobacco, such as cigarettes, e-cigarettes, and chewing tobacco. If you need help quitting, ask your health care provider.  If you are sexually active, practice safe sex. Use a condom or other form of birth control (contraception) in order to prevent pregnancy and STIs (sexually transmitted infections).  If told by your health care provider, take low-dose aspirin daily starting at age 61. What's next?  Visit your health care provider once a year for a well check  visit.  Ask your health care provider how often you should have your eyes and teeth checked.  Stay up to date on all vaccines. This information is not intended to replace advice given to you by your health care provider. Make sure you discuss any questions you have with your health care provider. Document Released: 12/14/2015 Document Revised: 07/29/2018 Document Reviewed: 07/29/2018 Elsevier Patient Education  2020 [employer].

## 2019-08-17 LAB — COMPLETE METABOLIC PANEL WITH GFR
AG Ratio: 1.1 (calc) (ref 1.0–2.5)
ALT: 11 U/L (ref 6–29)
AST: 16 U/L (ref 10–35)
Albumin: 4 g/dL (ref 3.6–5.1)
Alkaline phosphatase (APISO): 71 U/L (ref 31–125)
BUN: 11 mg/dL (ref 7–25)
CO2: 30 mmol/L (ref 20–32)
Calcium: 9.6 mg/dL (ref 8.6–10.2)
Chloride: 99 mmol/L (ref 98–110)
Creat: 0.94 mg/dL (ref 0.50–1.10)
GFR, Est African American: 83 mL/min/{1.73_m2} (ref >=60)
GFR, Est Non African American: 72 mL/min/{1.73_m2} (ref >=60)
Globulin: 3.8 g/dL (calc) — ABNORMAL HIGH (ref 1.9–3.7)
Glucose, Bld: 95 mg/dL (ref 65–99)
Potassium: 3.5 mmol/L (ref 3.5–5.3)
Sodium: 138 mmol/L (ref 135–146)
Total Bilirubin: 0.4 mg/dL (ref 0.2–1.2)
Total Protein: 7.8 g/dL (ref 6.1–8.1)

## 2019-08-17 LAB — CBC
HCT: 39.2 % (ref 35.0–45.0)
Hemoglobin: 13.2 g/dL (ref 11.7–15.5)
MCH: 28.8 pg (ref 27.0–33.0)
MCHC: 33.7 g/dL (ref 32.0–36.0)
MCV: 85.4 fL (ref 80.0–100.0)
MPV: 11.2 fL (ref 7.5–12.5)
Platelets: 301 10*3/uL (ref 140–400)
RBC: 4.59 10*6/uL (ref 3.80–5.10)
RDW: 12.4 % (ref 11.0–15.0)
WBC: 6.6 10*3/uL (ref 3.8–10.8)

## 2019-08-17 LAB — LIPID PANEL
Cholesterol: 139 mg/dL (ref <200)
HDL: 39 mg/dL — ABNORMAL LOW (ref >=50)
LDL Cholesterol (Calc): 84 mg/dL (calc)
Non-HDL Cholesterol (Calc): 100 mg/dL (calc) (ref <130)
Total CHOL/HDL Ratio: 3.6 (calc) (ref <5.0)
Triglycerides: 73 mg/dL (ref <150)

## 2019-08-17 LAB — TSH+FREE T4: TSH W/REFLEX TO FT4: 0.92 mIU/L

## 2019-10-31 ENCOUNTER — Other Ambulatory Visit (HOSPITAL_BASED_OUTPATIENT_CLINIC_OR_DEPARTMENT_OTHER): Payer: Self-pay | Admitting: Physician Assistant

## 2019-10-31 DIAGNOSIS — Z1231 Encounter for screening mammogram for malignant neoplasm of breast: Secondary | ICD-10-CM

## 2019-11-02 ENCOUNTER — Other Ambulatory Visit: Payer: Self-pay

## 2019-11-02 ENCOUNTER — Ambulatory Visit (INDEPENDENT_AMBULATORY_CARE_PROVIDER_SITE_OTHER): Payer: BC Managed Care – PPO

## 2019-11-02 DIAGNOSIS — Z1231 Encounter for screening mammogram for malignant neoplasm of breast: Secondary | ICD-10-CM | POA: Diagnosis not present

## 2019-11-16 ENCOUNTER — Ambulatory Visit: Payer: BC Managed Care – PPO | Admitting: Cardiology

## 2019-11-29 DIAGNOSIS — Z6835 Body mass index (BMI) 35.0-35.9, adult: Secondary | ICD-10-CM | POA: Diagnosis not present

## 2019-11-29 DIAGNOSIS — J45909 Unspecified asthma, uncomplicated: Secondary | ICD-10-CM | POA: Diagnosis not present

## 2019-11-29 DIAGNOSIS — I10 Essential (primary) hypertension: Secondary | ICD-10-CM | POA: Diagnosis not present

## 2019-11-29 DIAGNOSIS — E669 Obesity, unspecified: Secondary | ICD-10-CM | POA: Diagnosis not present

## 2019-11-30 ENCOUNTER — Ambulatory Visit: Payer: BC Managed Care – PPO | Attending: Internal Medicine

## 2019-11-30 DIAGNOSIS — Z20828 Contact with and (suspected) exposure to other viral communicable diseases: Secondary | ICD-10-CM | POA: Diagnosis not present

## 2019-11-30 DIAGNOSIS — Z20822 Contact with and (suspected) exposure to covid-19: Secondary | ICD-10-CM

## 2019-12-01 LAB — NOVEL CORONAVIRUS, NAA: SARS-CoV-2, NAA: NOT DETECTED

## 2019-12-15 ENCOUNTER — Encounter: Payer: Self-pay | Admitting: Cardiology

## 2019-12-15 ENCOUNTER — Other Ambulatory Visit: Payer: Self-pay

## 2019-12-15 ENCOUNTER — Ambulatory Visit (INDEPENDENT_AMBULATORY_CARE_PROVIDER_SITE_OTHER): Payer: BC Managed Care – PPO | Admitting: Cardiology

## 2019-12-15 VITALS — BP 142/84 | HR 96 | Ht 69.0 in | Wt 231.0 lb

## 2019-12-15 DIAGNOSIS — I10 Essential (primary) hypertension: Secondary | ICD-10-CM

## 2019-12-15 DIAGNOSIS — E782 Mixed hyperlipidemia: Secondary | ICD-10-CM

## 2019-12-15 DIAGNOSIS — E663 Overweight: Secondary | ICD-10-CM | POA: Diagnosis not present

## 2019-12-15 NOTE — Progress Notes (Signed)
Cardiology Office Note:    Date:  12/15/2019   ID:  Ashley Roberts, DOB 1971/09/19, MRN [id number]  PCP:  Trixie Dredge, PA-C  Cardiologist:  Jenean Lindau, MD   Referring MD: Ottis Stain*    ASSESSMENT:    1. Hypertension goal BP (blood pressure) < 130/80   2. Mixed dyslipidemia   3. Overweight    PLAN:    In order of problems listed above:  1. Primary prevention stressed with the patient.  Importance of compliance with diet and medication stressed and she vocalized understanding.  Diet was discussed for weight loss and risks of obesity explained and she promises to do better. 2. Essential hypertension: Blood pressure is better 3. Mixed dyslipidemia: Last available lipids were reviewed and they were fine.  These are followed by primary care physician. 4. Patient will be seen in follow-up appointment in 6 months or earlier if the patient has any concerns    Medication Adjustments/Labs and Tests Ordered: Current medicines are reviewed at length with the patient today.  Concerns regarding medicines are outlined above.  No orders of the defined types were placed in this encounter.  No orders of the defined types were placed in this encounter.    Chief Complaint  Patient presents with  . Follow-up     History of Present Illness:    Ashley Roberts is a 49 y.o. female.  Patient has past medical history of essential hypertension and dyslipidemia.  She is overweight and leads a sedentary lifestyle.  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  She denies any problems at this time and takes care of activities of daily living.  No chest pain orthopnea or PND.  At the time of my evaluation, the patient is alert awake oriented and in no distress.  Past Medical History:  Diagnosis Date  . Allergy   . Dyslipidemia 08/31/2018  . Dyspnea on exertion 08/25/2018  . Environmental allergies 03/09/2019  . Expiratory wheezing on left side of chest 08/25/2018  . History of  hysterectomy for benign disease 08/25/2018  . Hyperglycemia 08/31/2018  . Hypertension   . Hypertension goal BP (blood pressure) < 130/80 08/24/2018  . Obesity   . Reactive airway disease 09/01/2018  . Uterine fibroid     Past Surgical History:  Procedure Laterality Date  . ABDOMINAL HYSTERECTOMY      Current Medications: Current Meds  Medication Sig  . albuterol (VENTOLIN HFA) 108 (90 Base) MCG/ACT inhaler Inhale 1-2 puffs into the lungs every 4 (four) hours as needed for wheezing or shortness of breath (chest tightness, bronchospasm).  Marland Kitchen amLODipine (NORVASC) 5 MG tablet Take 1 tablet (5 mg total) by mouth daily.  Marland Kitchen aspirin EC 81 MG tablet Take 81 mg by mouth daily.  Marland Kitchen atorvastatin (LIPITOR) 10 MG tablet Take 1 tablet (10 mg total) by mouth daily.  Marland Kitchen olmesartan-hydrochlorothiazide (BENICAR HCT) 40-25 MG tablet Take 1 tablet by mouth daily.     Allergies:   Patient has no known allergies.   Social History   Socioeconomic History  . Marital status: Single    Spouse name: Not on file  . Number of children: Not on file  . Years of education: Not on file  . Highest education level: Not on file  Occupational History  . Not on file  Tobacco Use  . Smoking status: Never Smoker  . Smokeless tobacco: Never Used  Substance and Sexual Activity  . Alcohol use: Not Currently  . Drug  use: Never  . Sexual activity: Not Currently    Birth control/protection: Abstinence  Other Topics Concern  . Not on file  Social History Narrative  . Not on file   Social Determinants of Health   Financial Resource Strain:   . Difficulty of Paying Living Expenses: Not on file  Food Insecurity:   . Worried About Charity fundraiser in the Last Year: Not on file  . Ran Out of Food in the Last Year: Not on file  Transportation Needs:   . Lack of Transportation (Medical): Not on file  . Lack of Transportation (Non-Medical): Not on file  Physical Activity:   . Days of Exercise per Week: Not on file    . Minutes of Exercise per Session: Not on file  Stress:   . Feeling of Stress : Not on file  Social Connections:   . Frequency of Communication with Friends and Family: Not on file  . Frequency of Social Gatherings with Friends and Family: Not on file  . Attends Religious Services: Not on file  . Active Member of Clubs or Organizations: Not on file  . Attends Archivist Meetings: Not on file  . Marital Status: Not on file     Family History: The patient's family history includes Asthma in her cousin; Breast cancer in her maternal aunt; Cancer in her maternal uncle; Diabetes in her maternal aunt, maternal aunt, maternal aunt, maternal grandfather, and maternal grandmother; Heart attack in her cousin and maternal grandmother; Hypertension in her brother and mother; Prostate cancer in her maternal grandfather; Renal Disease in her maternal aunt.  ROS:   Please see the history of present illness.    All other systems reviewed and are negative.  EKGs/Labs/Other Studies Reviewed:    The following studies were reviewed today: Study Highlights    Nuclear stress EF: 72%.  There was no ST segment deviation noted during stress.  The study is normal.  This is a low risk study.  The left ventricular ejection fraction is hyperdynamic (>65%).   Normal stress nuclear study with no ischemia or infarction.  Gated ejection fraction 72% with normal wall motion.   IMPRESSIONS    1. The left ventricle has normal systolic function with an ejection fraction of 60-65%. The cavity size was normal. There is mild concentric left ventricular hypertrophy. Left ventricular diastolic Doppler parameters are consistent with impaired  relaxation. No evidence of left ventricular regional wall motion abnormalities.  2. The right ventricle has normal systolic function. The cavity was normal. There is no increase in right ventricular wall thickness.  3. No evidence of mitral valve stenosis.  4.  The aortic valve is tricuspid. No stenosis of the aortic valve.  5. The aorta is normal in size and structure.  6. The aortic root and ascending aorta are normal in size and structure.   Recent Labs: 08/16/2019: ALT 11; BUN 11; Creat 0.94; Hemoglobin 13.2; Platelets 301; Potassium 3.5; Sodium 138  Recent Lipid Panel    Component Value Date/Time   CHOL 139 08/16/2019 1030   TRIG 73 08/16/2019 1030   HDL 39 (L) 08/16/2019 1030   CHOLHDL 3.6 08/16/2019 1030   LDLCALC 84 08/16/2019 1030    Physical Exam:    VS:  BP (!) 142/84 (BP Location: Left Arm, Patient Position: Sitting, Cuff Size: Large)   Pulse 96   Ht 5\' 9"  (1.753 m)   Wt 231 lb (104.8 kg)   SpO2 98%   BMI 34.11  kg/m     Wt Readings from Last 3 Encounters:  12/15/19 231 lb (104.8 kg)  08/16/19 226 lb (102.5 kg)  07/14/19 224 lb (101.6 kg)     GEN: Patient is in no acute distress HEENT: Normal NECK: No JVD; No carotid bruits LYMPHATICS: No lymphadenopathy CARDIAC: Hear sounds regular, 2/6 systolic murmur at the apex. RESPIRATORY:  Clear to auscultation without rales, wheezing or rhonchi  ABDOMEN: Soft, non-tender, non-distended MUSCULOSKELETAL:  No edema; No deformity  SKIN: Warm and dry NEUROLOGIC:  Alert and oriented x 3 PSYCHIATRIC:  Normal affect   Signed, Jenean Lindau, MD  12/15/2019 10:44 AM    [place]

## 2019-12-15 NOTE — Patient Instructions (Signed)

## 2019-12-23 ENCOUNTER — Other Ambulatory Visit: Payer: Self-pay

## 2019-12-23 ENCOUNTER — Emergency Department (INDEPENDENT_AMBULATORY_CARE_PROVIDER_SITE_OTHER)
Admission: EM | Admit: 2019-12-23 | Discharge: 2019-12-23 | Disposition: A | Discharge status: Home / Self Care | Payer: BC Managed Care – PPO | Source: Home / Self Care

## 2019-12-23 DIAGNOSIS — R05 Cough: Secondary | ICD-10-CM

## 2019-12-23 DIAGNOSIS — R059 Cough, unspecified: Secondary | ICD-10-CM

## 2019-12-23 NOTE — ED Provider Notes (Signed)
[name]    CSN: [id number] Arrival date & time: 12/23/19  1026      History   Chief Complaint Chief Complaint  Patient presents with  . Cough  . chest congestion    HPI Ashley Roberts is a 49 y.o. female.   The history is provided by the patient. No language interpreter was used.  Cough Cough characteristics:  Non-productive Sputum characteristics:  Nondescript Severity:  Moderate Onset quality:  Gradual Duration:  2 days Timing:  Constant Progression:  Worsening Chronicity:  New Smoker: no   Relieved by:  Nothing Worsened by:  Nothing Ineffective treatments:  None tried Associated symptoms: no shortness of breath   Risk factors: no recent travel   Pt complains of  Dry cough.  Pt reports she has asthma Pt using inhaler   Past Medical History:  Diagnosis Date  . Allergy   . Dyslipidemia 08/31/2018  . Dyspnea on exertion 08/25/2018  . Environmental allergies 03/09/2019  . Expiratory wheezing on left side of chest 08/25/2018  . History of hysterectomy for benign disease 08/25/2018  . Hyperglycemia 08/31/2018  . Hypertension   . Hypertension goal BP (blood pressure) < 130/80 08/24/2018  . Obesity   . Reactive airway disease 09/01/2018  . Uterine fibroid     Patient Active Problem List   Diagnosis Date Noted  . Mixed dyslipidemia 12/15/2019  . Overweight 12/15/2019  . Colon cancer screening 08/16/2019  . Class 1 obesity due to excess calories with serious comorbidity and body mass index (BMI) of 33.0 to 33.9 in adult 08/16/2019  . Exertional chest pain 07/10/2019  . At risk for cardiovascular event 07/10/2019  . Dyslipidemia, goal LDL below 100 07/10/2019  . Family history of early CAD 07/10/2019  . Environmental allergies 03/09/2019  . Reactive airway disease 09/01/2018  . Hyperglycemia 08/31/2018  . Dyslipidemia 08/31/2018  . History of hysterectomy for benign disease 08/25/2018  . Expiratory wheezing on left side of chest 08/25/2018  .  Dyspnea on exertion 08/25/2018  . Hypertension goal BP (blood pressure) < 130/80 08/24/2018  . Breast cancer screening by mammogram 08/24/2018    Past Surgical History:  Procedure Laterality Date  . ABDOMINAL HYSTERECTOMY    . FRACTURE SURGERY     broken leg as a child    OB History    Gravida  1   Para  1   Term      Preterm      AB      Living  2     SAB      TAB      Ectopic      Multiple      Live Births               Home Medications    Prior to Admission medications   Medication Sig Start Date End Date Taking? Authorizing Provider  ADVAIR HFA 45-21 MCG/ACT inhaler 1 puff 2 (two) times daily. 11/30/19   [provider]  albuterol (VENTOLIN HFA) 108 (90 Base) MCG/ACT inhaler Inhale 1-2 puffs into the lungs every 4 (four) hours as needed for wheezing or shortness of breath (chest tightness, bronchospasm). 08/16/19   Trixie Dredge, PA-C  amLODipine (NORVASC) 5 MG tablet Take 1 tablet (5 mg total) by mouth daily. 08/16/19   Trixie Dredge, PA-C  aspirin EC 81 MG tablet Take 81 mg by mouth daily.    [provider]  atorvastatin (LIPITOR) 10 MG tablet Take 1 tablet (  10 mg total) by mouth daily. 08/16/19   Trixie Dredge, PA-C  FEXOFENADINE HCL PO Take by mouth.    [provider]  olmesartan-hydrochlorothiazide (BENICAR HCT) 40-25 MG tablet Take 1 tablet by mouth daily. 08/16/19   Trixie Dredge, PA-C    Family History Family History  Problem Relation Age of Onset  . Hypertension Mother   . Heart attack Maternal Grandmother   . Diabetes Maternal Grandmother   . Breast cancer Maternal Aunt   . Diabetes Maternal Aunt   . Cancer Maternal Uncle   . Prostate cancer Maternal Grandfather   . Diabetes Maternal Grandfather   . Asthma Cousin   . Heart attack Cousin   . Diabetes Maternal Aunt   . Renal Disease Maternal Aunt   . Diabetes Maternal Aunt   . Hypertension Brother      Social History Social History   Tobacco Use  . Smoking status: Never Smoker  . Smokeless tobacco: Never Used  Substance Use Topics  . Alcohol use: Not Currently  . Drug use: Never     Allergies   Patient has no known allergies.   Review of Systems Review of Systems  Respiratory: Positive for cough. Negative for shortness of breath.   All other systems reviewed and are negative.    Physical Exam Triage Vital Signs ED Triage Vitals  Enc Vitals Group     BP 12/23/19 1038 133/86     Pulse Rate 12/23/19 1038 100     Resp 12/23/19 1038 20     Temp 12/23/19 1038 98.5 F (36.9 C)     Temp Source 12/23/19 1038 Oral     SpO2 12/23/19 1038 99 %     Weight 12/23/19 1039 227 lb (103 kg)     Height 12/23/19 1039 5\' 9"  (1.753 m)     Head Circumference --      Peak Flow --      Pain Score 12/23/19 1039 0     Pain Loc --      Pain Edu? --      Excl. in [place]? --    No data found.  Updated Vital Signs BP 133/86 (BP Location: Right Arm)   Pulse 100   Temp 98.5 F (36.9 C) (Oral)   Resp 20   Ht 5\' 9"  (1.753 m)   Wt 103 kg   SpO2 99%   BMI 33.52 kg/m   Visual Acuity Right Eye Distance:   Left Eye Distance:   Bilateral Distance:    Right Eye Near:   Left Eye Near:    Bilateral Near:     Physical Exam Vitals and nursing note reviewed.  Constitutional:      Appearance: She is well-developed.  HENT:     Head: Normocephalic.     Right Ear: Tympanic membrane normal.     Left Ear: Tympanic membrane normal.     Nose: Nose normal.  Cardiovascular:     Rate and Rhythm: Normal rate and regular rhythm.  Pulmonary:     Effort: Pulmonary effort is normal.  Abdominal:     General: There is no distension.  Musculoskeletal:        General: Normal range of motion.     Cervical back: Normal range of motion.  Skin:    General: Skin is warm.  Neurological:     Mental Status: She is alert and oriented to person, place, and time.  Psychiatric:        Mood and  Affect:  Mood normal.      UC Treatments / Results  Labs (all labs ordered are listed, but only abnormal results are displayed) Labs Reviewed  NOVEL CORONAVIRUS, NAA    EKG   Radiology No results found.  Procedures Procedures (including critical care time)  Medications Ordered in UC Medications - No data to display  Initial Impression / Assessment and Plan / UC Course  I have reviewed the triage vital signs and the nursing notes.  Pertinent labs & imaging results that were available during my care of the patient were reviewed by me and considered in my medical decision making (see chart for details).     MDM  Covid test ordered. Pt counseled on covid  Final Clinical Impressions(s) / UC Diagnoses   Final diagnoses:  Cough     Discharge Instructions     Return if any problems    ED Prescriptions    None     PDMP not reviewed this encounter.   Fransico Meadow, [place] 12/23/19 1400

## 2019-12-23 NOTE — ED Triage Notes (Signed)
Pt states that she started with a dry cough yesterday.  Also has asthma, and feels better after inhaler.  This morning she felt better after inhaler, but this morning she is not 100%.

## 2019-12-23 NOTE — Discharge Instructions (Addendum)
Return if any problems.

## 2019-12-25 LAB — NOVEL CORONAVIRUS, NAA: SARS-CoV-2, NAA: NOT DETECTED

## 2020-01-10 DIAGNOSIS — Z7982 Long term (current) use of aspirin: Secondary | ICD-10-CM | POA: Diagnosis not present

## 2020-01-10 DIAGNOSIS — R000 Tachycardia, unspecified: Secondary | ICD-10-CM | POA: Diagnosis not present

## 2020-01-10 DIAGNOSIS — Z7951 Long term (current) use of inhaled steroids: Secondary | ICD-10-CM | POA: Diagnosis not present

## 2020-01-10 DIAGNOSIS — Z79899 Other long term (current) drug therapy: Secondary | ICD-10-CM | POA: Diagnosis not present

## 2020-01-10 DIAGNOSIS — R079 Chest pain, unspecified: Secondary | ICD-10-CM | POA: Diagnosis not present

## 2020-01-10 DIAGNOSIS — K219 Gastro-esophageal reflux disease without esophagitis: Secondary | ICD-10-CM | POA: Diagnosis not present

## 2020-01-10 DIAGNOSIS — I10 Essential (primary) hypertension: Secondary | ICD-10-CM | POA: Diagnosis not present

## 2020-01-24 DIAGNOSIS — R0789 Other chest pain: Secondary | ICD-10-CM | POA: Diagnosis not present

## 2020-01-24 DIAGNOSIS — K219 Gastro-esophageal reflux disease without esophagitis: Secondary | ICD-10-CM | POA: Diagnosis not present

## 2020-02-16 ENCOUNTER — Other Ambulatory Visit: Payer: Self-pay | Admitting: Physician Assistant

## 2020-02-16 DIAGNOSIS — J4520 Mild intermittent asthma, uncomplicated: Secondary | ICD-10-CM

## 2020-03-06 ENCOUNTER — Other Ambulatory Visit: Payer: Self-pay | Admitting: Physician Assistant

## 2020-03-06 DIAGNOSIS — J4520 Mild intermittent asthma, uncomplicated: Secondary | ICD-10-CM

## 2020-03-06 DIAGNOSIS — I10 Essential (primary) hypertension: Secondary | ICD-10-CM

## 2020-03-06 DIAGNOSIS — Z9189 Other specified personal risk factors, not elsewhere classified: Secondary | ICD-10-CM

## 2020-03-06 DIAGNOSIS — E785 Hyperlipidemia, unspecified: Secondary | ICD-10-CM

## 2020-03-23 DIAGNOSIS — Z1211 Encounter for screening for malignant neoplasm of colon: Secondary | ICD-10-CM | POA: Diagnosis not present

## 2020-03-23 DIAGNOSIS — K219 Gastro-esophageal reflux disease without esophagitis: Secondary | ICD-10-CM | POA: Diagnosis not present

## 2020-04-01 ENCOUNTER — Other Ambulatory Visit: Payer: Self-pay | Admitting: Osteopathic Medicine

## 2020-04-01 DIAGNOSIS — I10 Essential (primary) hypertension: Secondary | ICD-10-CM

## 2020-04-01 DIAGNOSIS — E785 Hyperlipidemia, unspecified: Secondary | ICD-10-CM

## 2020-04-01 DIAGNOSIS — Z9189 Other specified personal risk factors, not elsewhere classified: Secondary | ICD-10-CM

## 2020-04-02 ENCOUNTER — Other Ambulatory Visit: Payer: Self-pay | Admitting: Osteopathic Medicine

## 2020-04-02 DIAGNOSIS — J4520 Mild intermittent asthma, uncomplicated: Secondary | ICD-10-CM

## 2020-04-04 DIAGNOSIS — Z1211 Encounter for screening for malignant neoplasm of colon: Secondary | ICD-10-CM | POA: Diagnosis not present

## 2020-04-04 LAB — HM COLONOSCOPY

## 2020-04-09 ENCOUNTER — Other Ambulatory Visit: Payer: Self-pay | Admitting: Physician Assistant

## 2020-04-09 DIAGNOSIS — I10 Essential (primary) hypertension: Secondary | ICD-10-CM

## 2020-04-09 NOTE — Telephone Encounter (Signed)
Please call the patient and schedule an appointment for BP follow-up and to establish care with a new provider. She is a former patient of [facility] and I received a refill request for BP medication. I have provided one refill at this time until she is able to be seen and establish care with a new provider.

## 2020-04-09 NOTE — Telephone Encounter (Signed)
PT got a new pcp at [facility]  Joelene Millin Ward MD) Michela Pitcher she will contact px about them sending [place] the refill requests

## 2020-04-12 ENCOUNTER — Other Ambulatory Visit: Payer: Self-pay | Admitting: Family Medicine

## 2020-04-12 DIAGNOSIS — I10 Essential (primary) hypertension: Secondary | ICD-10-CM

## 2020-04-12 DIAGNOSIS — Z9189 Other specified personal risk factors, not elsewhere classified: Secondary | ICD-10-CM

## 2020-04-12 DIAGNOSIS — E785 Hyperlipidemia, unspecified: Secondary | ICD-10-CM

## 2020-04-25 DIAGNOSIS — Z6841 Body Mass Index (BMI) 40.0 and over, adult: Secondary | ICD-10-CM | POA: Diagnosis not present

## 2020-04-25 DIAGNOSIS — E785 Hyperlipidemia, unspecified: Secondary | ICD-10-CM | POA: Diagnosis not present

## 2020-04-25 DIAGNOSIS — I10 Essential (primary) hypertension: Secondary | ICD-10-CM | POA: Diagnosis not present

## 2020-04-25 DIAGNOSIS — J45909 Unspecified asthma, uncomplicated: Secondary | ICD-10-CM | POA: Diagnosis not present

## 2020-05-11 ENCOUNTER — Other Ambulatory Visit: Payer: Self-pay

## 2020-05-11 ENCOUNTER — Emergency Department
Admission: RE | Admit: 2020-05-11 | Discharge: 2020-05-11 | Discharge status: Against Medical Advice | Payer: BC Managed Care – PPO | Source: Ambulatory Visit | Attending: Family Medicine | Admitting: Family Medicine

## 2020-05-28 DIAGNOSIS — Z0000 Encounter for general adult medical examination without abnormal findings: Secondary | ICD-10-CM | POA: Diagnosis not present

## 2020-05-28 DIAGNOSIS — R739 Hyperglycemia, unspecified: Secondary | ICD-10-CM | POA: Diagnosis not present

## 2020-05-28 DIAGNOSIS — E785 Hyperlipidemia, unspecified: Secondary | ICD-10-CM | POA: Diagnosis not present

## 2020-05-30 DIAGNOSIS — Z6841 Body Mass Index (BMI) 40.0 and over, adult: Secondary | ICD-10-CM | POA: Diagnosis not present

## 2020-05-30 DIAGNOSIS — Z0000 Encounter for general adult medical examination without abnormal findings: Secondary | ICD-10-CM | POA: Diagnosis not present

## 2020-05-30 DIAGNOSIS — E119 Type 2 diabetes mellitus without complications: Secondary | ICD-10-CM | POA: Diagnosis not present

## 2020-05-30 DIAGNOSIS — E669 Obesity, unspecified: Secondary | ICD-10-CM | POA: Diagnosis not present

## 2020-05-30 DIAGNOSIS — I10 Essential (primary) hypertension: Secondary | ICD-10-CM | POA: Diagnosis not present

## 2020-05-31 IMAGING — MG DIGITAL SCREENING BILAT W/ TOMO W/ CAD
8 series · 8 of 24 positions shown · non-contrast
Comparison: Previous exam(s).

CLINICAL DATA: Screening.

EXAM:
DIGITAL SCREENING BILATERAL MAMMOGRAM WITH TOMO AND CAD

[L CC synth-2D]
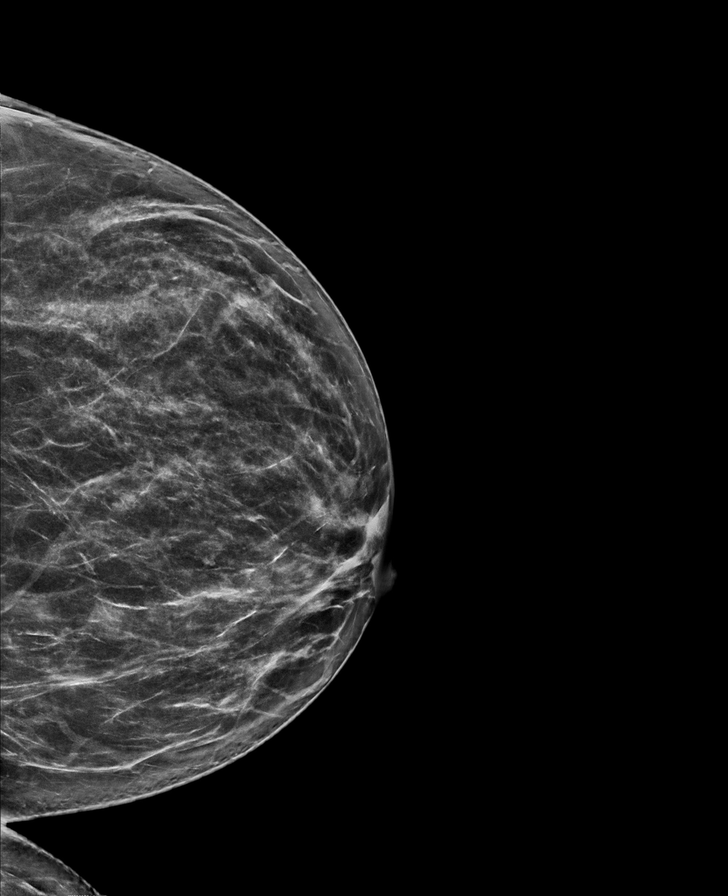

[R MLO synth-2D]
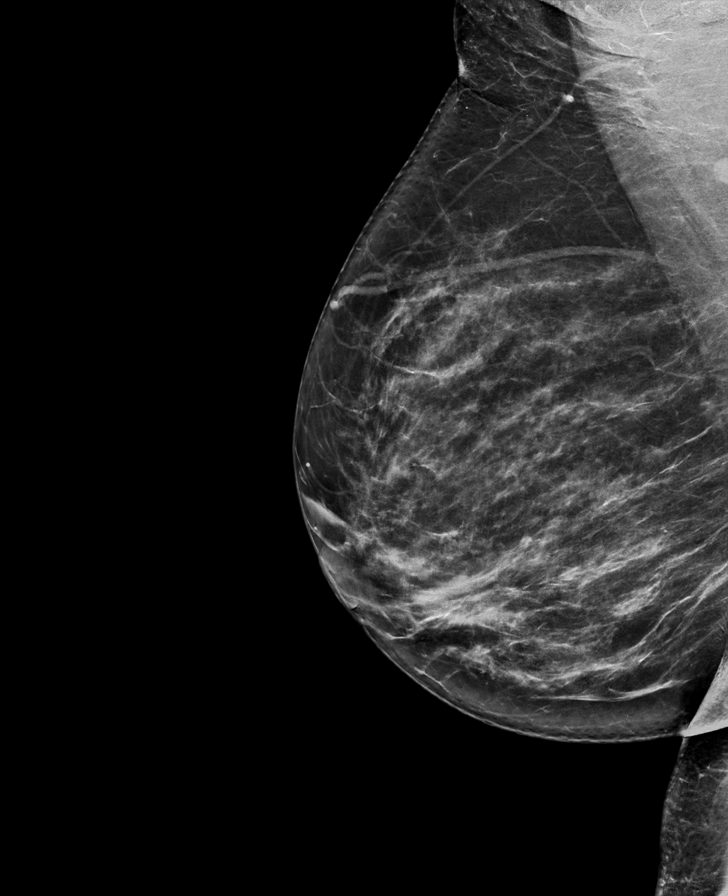

[L MLO synth-2D]
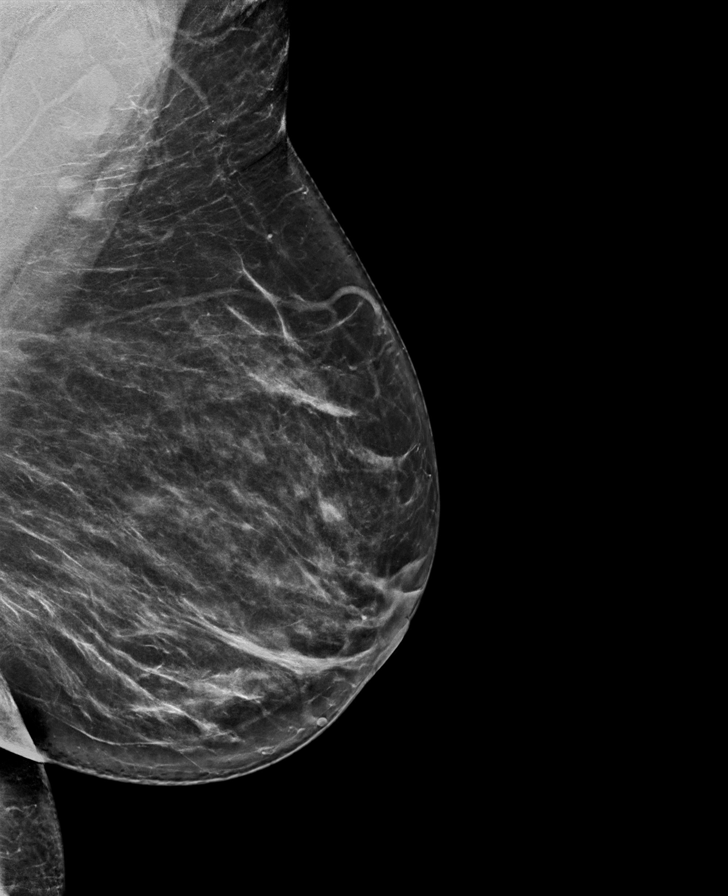

[R CC synth-2D]
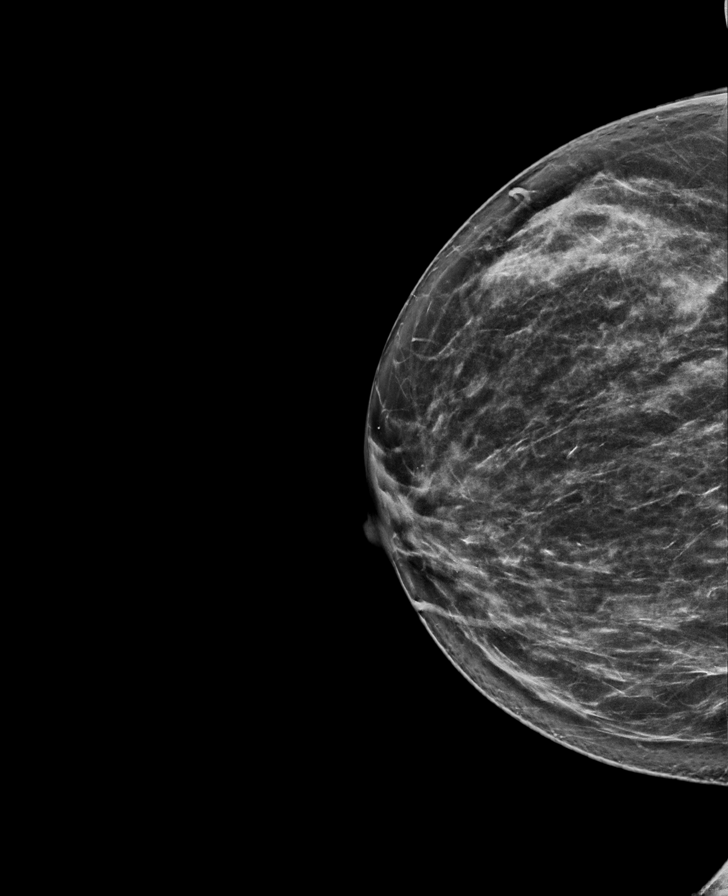

[L CC tomo · tomo slice 41/80.0]
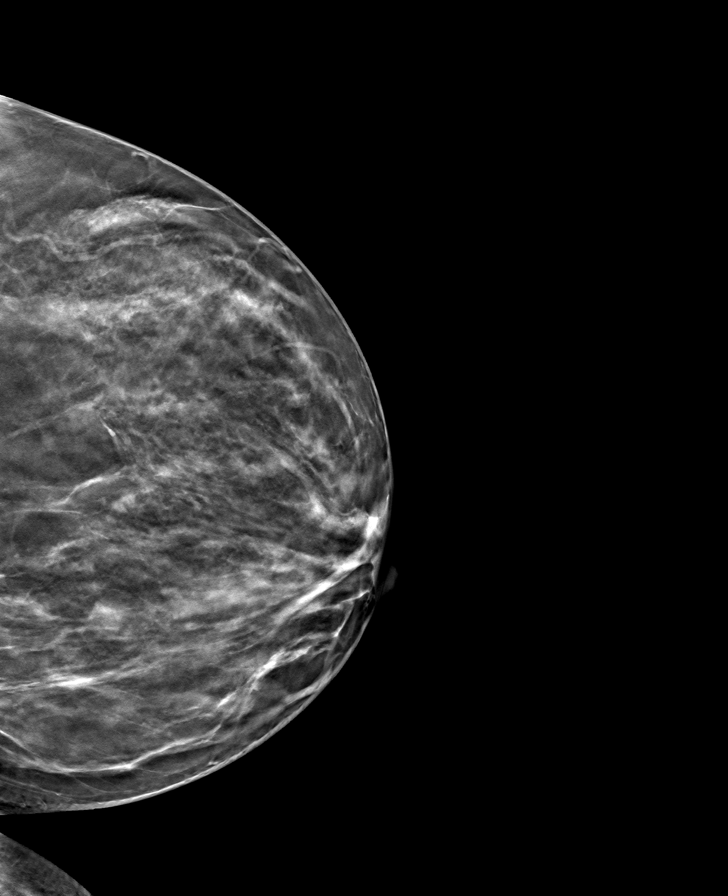

[L MLO tomo · tomo slice 46/91.0]
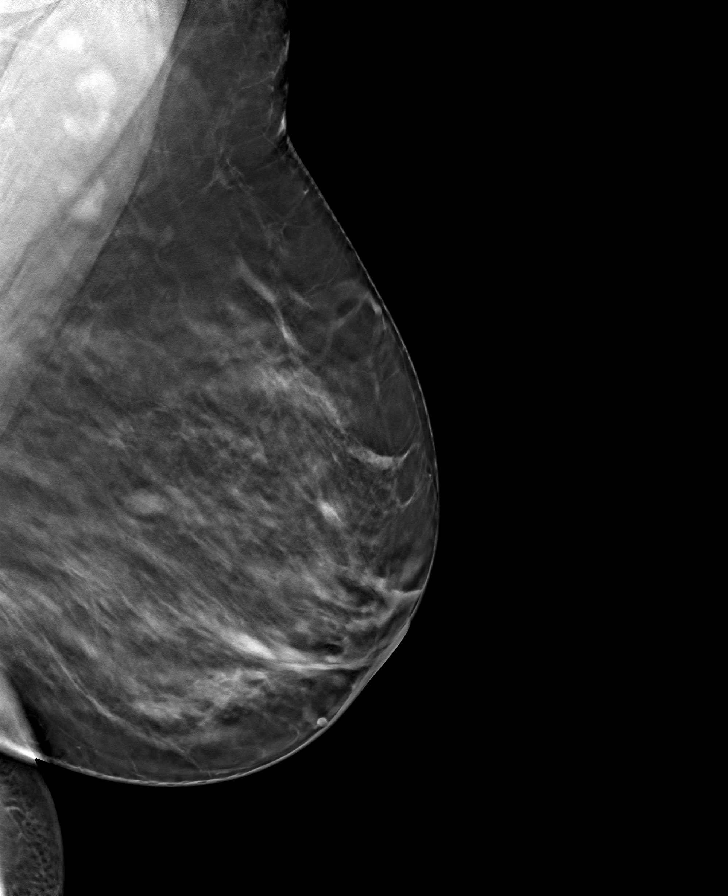

[R CC tomo · tomo slice 41/80.0]
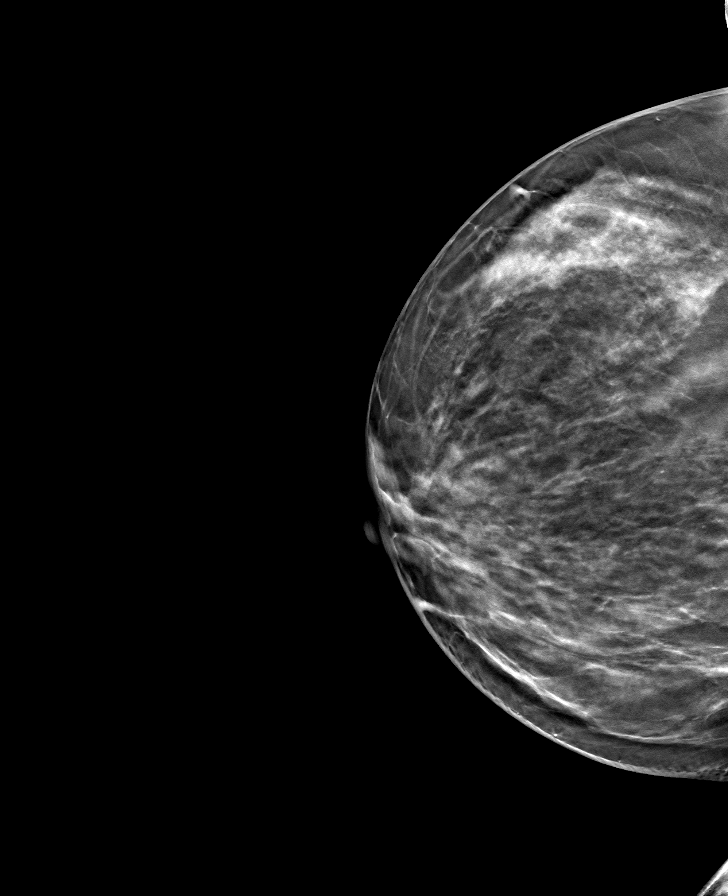

[R MLO tomo · tomo slice 43/85.0]
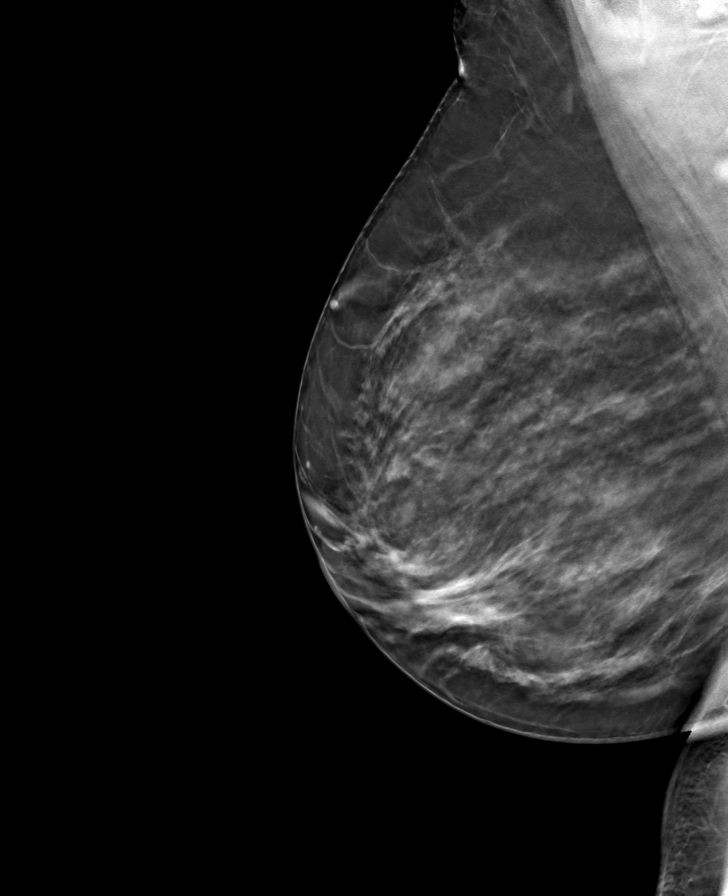

[8 of 24 positions shown; findings below may reference images not displayed]

ACR Breast Density Category c: The breast tissue is heterogeneously
dense, which may obscure small masses.
FINDINGS: There are no findings suspicious for malignancy. Images were
processed with CAD.
IMPRESSION: No mammographic evidence of malignancy. A result letter of this
screening mammogram will be mailed directly to the patient.

RECOMMENDATION:
Screening mammogram in one year. (Code:FT-U-LHB)

BI-RADS CATEGORY  1: Negative.

## 2020-07-07 ENCOUNTER — Other Ambulatory Visit: Payer: Self-pay | Admitting: Nurse Practitioner

## 2020-07-07 DIAGNOSIS — I10 Essential (primary) hypertension: Secondary | ICD-10-CM

## 2020-07-09 ENCOUNTER — Ambulatory Visit: Payer: BC Managed Care – PPO | Admitting: Cardiology

## 2020-07-23 ENCOUNTER — Ambulatory Visit (INDEPENDENT_AMBULATORY_CARE_PROVIDER_SITE_OTHER): Payer: BC Managed Care – PPO | Admitting: Cardiology

## 2020-07-23 ENCOUNTER — Encounter: Payer: Self-pay | Admitting: Cardiology

## 2020-07-23 ENCOUNTER — Other Ambulatory Visit: Payer: Self-pay

## 2020-07-23 VITALS — BP 128/78 | HR 80 | Ht 69.0 in | Wt 221.1 lb

## 2020-07-23 DIAGNOSIS — E785 Hyperlipidemia, unspecified: Secondary | ICD-10-CM

## 2020-07-23 DIAGNOSIS — I10 Essential (primary) hypertension: Secondary | ICD-10-CM

## 2020-07-23 DIAGNOSIS — E782 Mixed hyperlipidemia: Secondary | ICD-10-CM | POA: Diagnosis not present

## 2020-07-23 NOTE — Patient Instructions (Signed)

## 2020-07-23 NOTE — Progress Notes (Signed)
Cardiology Office Note:    Date:  07/23/2020   ID:  MARGERT EDSALL, DOB 1971-01-17, MRN [id number]  PCP:  Ward, Carlyon Shadow, FNP  Cardiologist:  Jenean Lindau, MD   Referring MD: Ward, Carlyon Shadow, FNP    ASSESSMENT:    1. Dyslipidemia, goal LDL below 100   2. Mixed dyslipidemia   3. Hypertension goal BP (blood pressure) < 130/80    PLAN:    In order of problems listed above:  1. Primary prevention stressed with the patient.  Importance of compliance with diet medication stressed and she vocalized understanding. 2. Essential hypertension: Blood pressure stable and diet was emphasized. 3. Mixed dyslipidemia: On statin therapy.  Diet was emphasized.  I emphasized to her the importance of regular exercise and told her to walk at least half an hour a day 5 days a week and she promises to do so. 4. Overweight: Diet was emphasized and risks of obesity explained and he vocalized understanding. 5. Patient will be seen in follow-up appointment in 6 months or earlier if the patient has any concerns    Medication Adjustments/Labs and Tests Ordered: Current medicines are reviewed at length with the patient today.  Concerns regarding medicines are outlined above.  No orders of the defined types were placed in this encounter.  No orders of the defined types were placed in this encounter.    No chief complaint on file.    History of Present Illness:    Samar Venneman Brines is a 49 y.o. female.  Patient has past medical history of essential hypertension and dyslipidemia.  She is overweight and leads a sedentary lifestyle.  She has a Network engineer job.  She denies any chest pain orthopnea or PND.  At the time of my evaluation, the patient is alert awake oriented and in no distress.  Past Medical History:  Diagnosis Date  . Allergy   . Dyslipidemia 08/31/2018  . Dyspnea on exertion 08/25/2018  . Environmental allergies 03/09/2019  . Expiratory wheezing on left side of chest 08/25/2018  . History  of hysterectomy for benign disease 08/25/2018  . Hyperglycemia 08/31/2018  . Hypertension   . Hypertension goal BP (blood pressure) < 130/80 08/24/2018  . Obesity   . Reactive airway disease 09/01/2018  . Uterine fibroid     Past Surgical History:  Procedure Laterality Date  . ABDOMINAL HYSTERECTOMY    . FRACTURE SURGERY     broken leg as a child    Current Medications: Current Meds  Medication Sig  . ADVAIR HFA 45-21 MCG/ACT inhaler 1 puff 2 (two) times daily.  Marland Kitchen albuterol (VENTOLIN HFA) 108 (90 Base) MCG/ACT inhaler INHALE 1-2 PUFFS INTO THE LUNGS EVERY 4 (FOUR) HOURS AS NEEDED FOR WHEEZING OR SHORTNESS OF BREATH (CHEST TIGHTNESS, BRONCHOSPASM).  Marland Kitchen amLODipine (NORVASC) 5 MG tablet Take 1 tablet (5 mg total) by mouth daily. Must schedule appt for refills  . aspirin EC 81 MG tablet Take 81 mg by mouth daily.  Marland Kitchen atorvastatin (LIPITOR) 10 MG tablet Take 1 tablet (10 mg total) by mouth daily. Must make appt for refills  . FEXOFENADINE HCL PO Take by mouth.  . metFORMIN (GLUCOPHAGE-XR) 500 MG 24 hr tablet Take 500 mg by mouth daily.  Marland Kitchen olmesartan-hydrochlorothiazide (BENICAR HCT) 40-25 MG tablet TAKE 1 TABLET BY MOUTH EVERY DAY  . pantoprazole (PROTONIX) 40 MG tablet Take 40 mg by mouth daily.  . sucralfate (CARAFATE) 1 g tablet Take 1 g by mouth 4 (four) times daily as  needed.     Allergies:   Patient has no known allergies.   Social History   Socioeconomic History  . Marital status: Single    Spouse name: Not on file  . Number of children: Not on file  . Years of education: Not on file  . Highest education level: Not on file  Occupational History  . Not on file  Tobacco Use  . Smoking status: Never Smoker  . Smokeless tobacco: Never Used  Vaping Use  . Vaping Use: Never used  Substance and Sexual Activity  . Alcohol use: Not Currently  . Drug use: Never  . Sexual activity: Not Currently    Birth control/protection: Abstinence  Other Topics Concern  . Not on file    Social History Narrative  . Not on file   Social Determinants of Health   Financial Resource Strain:   . Difficulty of Paying Living Expenses: Not on file  Food Insecurity:   . Worried About Charity fundraiser in the Last Year: Not on file  . Ran Out of Food in the Last Year: Not on file  Transportation Needs:   . Lack of Transportation (Medical): Not on file  . Lack of Transportation (Non-Medical): Not on file  Physical Activity:   . Days of Exercise per Week: Not on file  . Minutes of Exercise per Session: Not on file  Stress:   . Feeling of Stress : Not on file  Social Connections:   . Frequency of Communication with Friends and Family: Not on file  . Frequency of Social Gatherings with Friends and Family: Not on file  . Attends Religious Services: Not on file  . Active Member of Clubs or Organizations: Not on file  . Attends Archivist Meetings: Not on file  . Marital Status: Not on file     Family History: The patient's family history includes Asthma in her cousin; Breast cancer in her maternal aunt; Cancer in her maternal uncle; Diabetes in her maternal aunt, maternal aunt, maternal aunt, maternal grandfather, and maternal grandmother; Heart attack in her cousin and maternal grandmother; Hypertension in her brother and mother; Prostate cancer in her maternal grandfather; Renal Disease in her maternal aunt.  ROS:   Please see the history of present illness.    All other systems reviewed and are negative.  EKGs/Labs/Other Studies Reviewed:    The following studies were reviewed today: EKG reveals sinus rhythm and nonspecific ST-T changes.   Recent Labs: 08/16/2019: ALT 11; BUN 11; Creat 0.94; Hemoglobin 13.2; Platelets 301; Potassium 3.5; Sodium 138  Recent Lipid Panel    Component Value Date/Time   CHOL 139 08/16/2019 1030   TRIG 73 08/16/2019 1030   HDL 39 (L) 08/16/2019 1030   CHOLHDL 3.6 08/16/2019 1030   LDLCALC 84 08/16/2019 1030    Physical  Exam:    VS:  BP 128/78   Pulse 80   Ht 5\' 9"  (1.753 m)   Wt 221 lb 1.9 oz (100.3 kg)   SpO2 97%   BMI 32.65 kg/m     Wt Readings from Last 3 Encounters:  07/23/20 221 lb 1.9 oz (100.3 kg)  12/23/19 227 lb (103 kg)  12/15/19 231 lb (104.8 kg)     GEN: Patient is in no acute distress HEENT: Normal NECK: No JVD; No carotid bruits LYMPHATICS: No lymphadenopathy CARDIAC: Hear sounds regular, 2/6 systolic murmur at the apex. RESPIRATORY:  Clear to auscultation without rales, wheezing or rhonchi  ABDOMEN: Soft,  non-tender, non-distended MUSCULOSKELETAL:  No edema; No deformity  SKIN: Warm and dry NEUROLOGIC:  Alert and oriented x 3 PSYCHIATRIC:  Normal affect   Signed, Jenean Lindau, MD  07/23/2020 2:35 PM    [facility]

## 2020-08-09 ENCOUNTER — Other Ambulatory Visit: Payer: Self-pay | Admitting: Osteopathic Medicine

## 2020-08-09 ENCOUNTER — Other Ambulatory Visit: Payer: Self-pay | Admitting: Nurse Practitioner

## 2020-08-09 DIAGNOSIS — I10 Essential (primary) hypertension: Secondary | ICD-10-CM

## 2020-08-09 DIAGNOSIS — J4520 Mild intermittent asthma, uncomplicated: Secondary | ICD-10-CM

## 2020-09-02 ENCOUNTER — Other Ambulatory Visit: Payer: Self-pay | Admitting: Osteopathic Medicine

## 2020-09-02 DIAGNOSIS — J4520 Mild intermittent asthma, uncomplicated: Secondary | ICD-10-CM

## 2020-09-05 ENCOUNTER — Other Ambulatory Visit: Payer: Self-pay | Admitting: Nurse Practitioner

## 2020-09-05 DIAGNOSIS — I10 Essential (primary) hypertension: Secondary | ICD-10-CM | POA: Diagnosis not present

## 2020-09-05 DIAGNOSIS — E119 Type 2 diabetes mellitus without complications: Secondary | ICD-10-CM | POA: Diagnosis not present

## 2020-09-05 DIAGNOSIS — L309 Dermatitis, unspecified: Secondary | ICD-10-CM | POA: Diagnosis not present

## 2020-09-05 DIAGNOSIS — Z23 Encounter for immunization: Secondary | ICD-10-CM | POA: Diagnosis not present

## 2020-09-19 ENCOUNTER — Other Ambulatory Visit: Payer: Self-pay | Admitting: Nurse Practitioner

## 2020-09-19 ENCOUNTER — Other Ambulatory Visit: Payer: Self-pay | Admitting: Osteopathic Medicine

## 2020-09-19 DIAGNOSIS — I10 Essential (primary) hypertension: Secondary | ICD-10-CM

## 2020-09-19 DIAGNOSIS — J4520 Mild intermittent asthma, uncomplicated: Secondary | ICD-10-CM

## 2020-09-25 ENCOUNTER — Other Ambulatory Visit: Payer: Self-pay | Admitting: Nurse Practitioner

## 2020-09-25 DIAGNOSIS — I10 Essential (primary) hypertension: Secondary | ICD-10-CM

## 2020-09-26 ENCOUNTER — Other Ambulatory Visit: Payer: Self-pay | Admitting: Nurse Practitioner

## 2020-09-26 DIAGNOSIS — I10 Essential (primary) hypertension: Secondary | ICD-10-CM

## 2020-09-27 ENCOUNTER — Other Ambulatory Visit: Payer: Self-pay | Admitting: Physician Assistant

## 2020-09-27 DIAGNOSIS — I10 Essential (primary) hypertension: Secondary | ICD-10-CM

## 2020-11-02 DIAGNOSIS — E119 Type 2 diabetes mellitus without complications: Secondary | ICD-10-CM | POA: Diagnosis not present

## 2020-11-02 DIAGNOSIS — M722 Plantar fascial fibromatosis: Secondary | ICD-10-CM | POA: Diagnosis not present

## 2020-11-02 DIAGNOSIS — Z0000 Encounter for general adult medical examination without abnormal findings: Secondary | ICD-10-CM | POA: Diagnosis not present

## 2020-11-02 DIAGNOSIS — I10 Essential (primary) hypertension: Secondary | ICD-10-CM | POA: Diagnosis not present

## 2020-11-02 DIAGNOSIS — E785 Hyperlipidemia, unspecified: Secondary | ICD-10-CM | POA: Diagnosis not present

## 2020-12-18 DIAGNOSIS — J45909 Unspecified asthma, uncomplicated: Secondary | ICD-10-CM | POA: Diagnosis not present

## 2020-12-18 DIAGNOSIS — J069 Acute upper respiratory infection, unspecified: Secondary | ICD-10-CM | POA: Diagnosis not present

## 2021-01-21 DIAGNOSIS — L810 Postinflammatory hyperpigmentation: Secondary | ICD-10-CM | POA: Diagnosis not present

## 2021-01-21 DIAGNOSIS — L859 Epidermal thickening, unspecified: Secondary | ICD-10-CM | POA: Diagnosis not present

## 2021-01-23 ENCOUNTER — Ambulatory Visit: Payer: BC Managed Care – PPO | Admitting: Cardiology

## 2021-03-11 DIAGNOSIS — G43009 Migraine without aura, not intractable, without status migrainosus: Secondary | ICD-10-CM | POA: Diagnosis not present

## 2021-04-15 DIAGNOSIS — U071 COVID-19: Secondary | ICD-10-CM | POA: Diagnosis not present

## 2021-04-30 DIAGNOSIS — M25511 Pain in right shoulder: Secondary | ICD-10-CM | POA: Diagnosis not present

## 2021-05-01 DIAGNOSIS — M25511 Pain in right shoulder: Secondary | ICD-10-CM | POA: Diagnosis not present

## 2021-05-03 DIAGNOSIS — Z0000 Encounter for general adult medical examination without abnormal findings: Secondary | ICD-10-CM | POA: Diagnosis not present

## 2021-05-09 DIAGNOSIS — Z0000 Encounter for general adult medical examination without abnormal findings: Secondary | ICD-10-CM | POA: Diagnosis not present

## 2021-05-09 DIAGNOSIS — Z6835 Body mass index (BMI) 35.0-35.9, adult: Secondary | ICD-10-CM | POA: Diagnosis not present

## 2021-05-09 DIAGNOSIS — E119 Type 2 diabetes mellitus without complications: Secondary | ICD-10-CM | POA: Diagnosis not present

## 2021-05-09 DIAGNOSIS — E785 Hyperlipidemia, unspecified: Secondary | ICD-10-CM | POA: Diagnosis not present

## 2021-05-09 DIAGNOSIS — I10 Essential (primary) hypertension: Secondary | ICD-10-CM | POA: Diagnosis not present

## 2021-05-27 DIAGNOSIS — Z1231 Encounter for screening mammogram for malignant neoplasm of breast: Secondary | ICD-10-CM | POA: Diagnosis not present

## 2021-06-11 ENCOUNTER — Other Ambulatory Visit: Payer: Self-pay | Admitting: Osteopathic Medicine

## 2021-06-11 DIAGNOSIS — J4520 Mild intermittent asthma, uncomplicated: Secondary | ICD-10-CM

## 2021-07-02 DIAGNOSIS — H10013 Acute follicular conjunctivitis, bilateral: Secondary | ICD-10-CM | POA: Diagnosis not present

## 2021-08-31 DIAGNOSIS — Z7189 Other specified counseling: Secondary | ICD-10-CM | POA: Diagnosis not present

## 2021-10-01 DIAGNOSIS — Z7189 Other specified counseling: Secondary | ICD-10-CM | POA: Diagnosis not present

## 2021-10-01 DIAGNOSIS — E119 Type 2 diabetes mellitus without complications: Secondary | ICD-10-CM | POA: Diagnosis not present

## 2021-10-31 DIAGNOSIS — E119 Type 2 diabetes mellitus without complications: Secondary | ICD-10-CM | POA: Diagnosis not present

## 2021-10-31 DIAGNOSIS — Z7189 Other specified counseling: Secondary | ICD-10-CM | POA: Diagnosis not present

## 2021-11-08 DIAGNOSIS — K219 Gastro-esophageal reflux disease without esophagitis: Secondary | ICD-10-CM | POA: Diagnosis not present

## 2021-11-08 DIAGNOSIS — Z23 Encounter for immunization: Secondary | ICD-10-CM | POA: Diagnosis not present

## 2021-11-08 DIAGNOSIS — E1169 Type 2 diabetes mellitus with other specified complication: Secondary | ICD-10-CM | POA: Diagnosis not present

## 2021-11-08 DIAGNOSIS — I10 Essential (primary) hypertension: Secondary | ICD-10-CM | POA: Diagnosis not present

## 2021-11-08 DIAGNOSIS — E785 Hyperlipidemia, unspecified: Secondary | ICD-10-CM | POA: Diagnosis not present

## 2021-11-08 DIAGNOSIS — J4540 Moderate persistent asthma, uncomplicated: Secondary | ICD-10-CM | POA: Diagnosis not present

## 2021-12-01 DIAGNOSIS — E119 Type 2 diabetes mellitus without complications: Secondary | ICD-10-CM | POA: Diagnosis not present

## 2022-01-01 DIAGNOSIS — J0110 Acute frontal sinusitis, unspecified: Secondary | ICD-10-CM | POA: Diagnosis not present

## 2022-01-01 DIAGNOSIS — E1169 Type 2 diabetes mellitus with other specified complication: Secondary | ICD-10-CM | POA: Diagnosis not present

## 2022-01-01 DIAGNOSIS — E119 Type 2 diabetes mellitus without complications: Secondary | ICD-10-CM | POA: Diagnosis not present

## 2022-01-29 DIAGNOSIS — E119 Type 2 diabetes mellitus without complications: Secondary | ICD-10-CM | POA: Diagnosis not present

## 2022-03-01 DIAGNOSIS — E119 Type 2 diabetes mellitus without complications: Secondary | ICD-10-CM | POA: Diagnosis not present

## 2022-03-31 DIAGNOSIS — E119 Type 2 diabetes mellitus without complications: Secondary | ICD-10-CM | POA: Diagnosis not present

## 2022-05-01 DIAGNOSIS — E119 Type 2 diabetes mellitus without complications: Secondary | ICD-10-CM | POA: Diagnosis not present

## 2022-05-02 DIAGNOSIS — Z0000 Encounter for general adult medical examination without abnormal findings: Secondary | ICD-10-CM | POA: Diagnosis not present

## 2022-05-27 DIAGNOSIS — E785 Hyperlipidemia, unspecified: Secondary | ICD-10-CM | POA: Diagnosis not present

## 2022-05-27 DIAGNOSIS — Z0000 Encounter for general adult medical examination without abnormal findings: Secondary | ICD-10-CM | POA: Diagnosis not present

## 2022-05-27 DIAGNOSIS — Z23 Encounter for immunization: Secondary | ICD-10-CM | POA: Diagnosis not present

## 2022-05-27 DIAGNOSIS — E119 Type 2 diabetes mellitus without complications: Secondary | ICD-10-CM | POA: Diagnosis not present

## 2022-05-27 DIAGNOSIS — I10 Essential (primary) hypertension: Secondary | ICD-10-CM | POA: Diagnosis not present

## 2022-05-27 DIAGNOSIS — Z6835 Body mass index (BMI) 35.0-35.9, adult: Secondary | ICD-10-CM | POA: Diagnosis not present

## 2022-05-31 DIAGNOSIS — E119 Type 2 diabetes mellitus without complications: Secondary | ICD-10-CM | POA: Diagnosis not present

## 2022-06-02 DIAGNOSIS — Z1231 Encounter for screening mammogram for malignant neoplasm of breast: Secondary | ICD-10-CM | POA: Diagnosis not present

## 2022-06-18 DIAGNOSIS — R6884 Jaw pain: Secondary | ICD-10-CM | POA: Diagnosis not present

## 2022-06-18 DIAGNOSIS — I889 Nonspecific lymphadenitis, unspecified: Secondary | ICD-10-CM | POA: Diagnosis not present

## 2022-07-01 DIAGNOSIS — E119 Type 2 diabetes mellitus without complications: Secondary | ICD-10-CM | POA: Diagnosis not present

## 2022-08-01 DIAGNOSIS — E119 Type 2 diabetes mellitus without complications: Secondary | ICD-10-CM | POA: Diagnosis not present

## 2022-08-31 DIAGNOSIS — E119 Type 2 diabetes mellitus without complications: Secondary | ICD-10-CM | POA: Diagnosis not present

## 2022-10-01 DIAGNOSIS — E119 Type 2 diabetes mellitus without complications: Secondary | ICD-10-CM | POA: Diagnosis not present

## 2022-10-31 DIAGNOSIS — E119 Type 2 diabetes mellitus without complications: Secondary | ICD-10-CM | POA: Diagnosis not present

## 2022-11-27 DIAGNOSIS — E119 Type 2 diabetes mellitus without complications: Secondary | ICD-10-CM | POA: Diagnosis not present

## 2022-11-27 DIAGNOSIS — Z23 Encounter for immunization: Secondary | ICD-10-CM | POA: Diagnosis not present

## 2022-11-27 DIAGNOSIS — E785 Hyperlipidemia, unspecified: Secondary | ICD-10-CM | POA: Diagnosis not present

## 2022-11-27 DIAGNOSIS — I10 Essential (primary) hypertension: Secondary | ICD-10-CM | POA: Diagnosis not present

## 2022-12-22 DIAGNOSIS — M542 Cervicalgia: Secondary | ICD-10-CM | POA: Diagnosis not present

## 2022-12-22 DIAGNOSIS — M25511 Pain in right shoulder: Secondary | ICD-10-CM | POA: Diagnosis not present

## 2022-12-22 DIAGNOSIS — S46811D Strain of other muscles, fascia and tendons at shoulder and upper arm level, right arm, subsequent encounter: Secondary | ICD-10-CM | POA: Diagnosis not present

## 2023-01-06 DIAGNOSIS — S46811D Strain of other muscles, fascia and tendons at shoulder and upper arm level, right arm, subsequent encounter: Secondary | ICD-10-CM | POA: Diagnosis not present

## 2023-01-06 DIAGNOSIS — M542 Cervicalgia: Secondary | ICD-10-CM | POA: Diagnosis not present

## 2023-01-06 DIAGNOSIS — M25511 Pain in right shoulder: Secondary | ICD-10-CM | POA: Diagnosis not present

## 2023-01-20 DIAGNOSIS — R519 Headache, unspecified: Secondary | ICD-10-CM | POA: Diagnosis not present

## 2023-02-18 DIAGNOSIS — E785 Hyperlipidemia, unspecified: Secondary | ICD-10-CM | POA: Diagnosis not present

## 2023-02-18 DIAGNOSIS — J4540 Moderate persistent asthma, uncomplicated: Secondary | ICD-10-CM | POA: Diagnosis not present

## 2023-02-18 DIAGNOSIS — E1165 Type 2 diabetes mellitus with hyperglycemia: Secondary | ICD-10-CM | POA: Diagnosis not present

## 2023-02-18 DIAGNOSIS — I10 Essential (primary) hypertension: Secondary | ICD-10-CM | POA: Diagnosis not present

## 2023-02-18 DIAGNOSIS — Z131 Encounter for screening for diabetes mellitus: Secondary | ICD-10-CM | POA: Diagnosis not present

## 2023-06-23 DIAGNOSIS — Z6835 Body mass index (BMI) 35.0-35.9, adult: Secondary | ICD-10-CM | POA: Diagnosis not present

## 2023-06-23 DIAGNOSIS — E119 Type 2 diabetes mellitus without complications: Secondary | ICD-10-CM | POA: Diagnosis not present

## 2023-06-23 DIAGNOSIS — E785 Hyperlipidemia, unspecified: Secondary | ICD-10-CM | POA: Diagnosis not present

## 2023-06-23 DIAGNOSIS — I10 Essential (primary) hypertension: Secondary | ICD-10-CM | POA: Diagnosis not present

## 2023-06-23 DIAGNOSIS — J4520 Mild intermittent asthma, uncomplicated: Secondary | ICD-10-CM | POA: Diagnosis not present

## 2023-06-23 DIAGNOSIS — Z1331 Encounter for screening for depression: Secondary | ICD-10-CM | POA: Diagnosis not present

## 2023-06-23 DIAGNOSIS — Z0000 Encounter for general adult medical examination without abnormal findings: Secondary | ICD-10-CM | POA: Diagnosis not present

## 2023-07-09 DIAGNOSIS — M67813 Other specified disorders of tendon, right shoulder: Secondary | ICD-10-CM | POA: Diagnosis not present

## 2023-07-24 DIAGNOSIS — M67813 Other specified disorders of tendon, right shoulder: Secondary | ICD-10-CM | POA: Diagnosis not present

## 2023-07-24 DIAGNOSIS — E876 Hypokalemia: Secondary | ICD-10-CM | POA: Diagnosis not present

## 2023-07-24 DIAGNOSIS — M25511 Pain in right shoulder: Secondary | ICD-10-CM | POA: Diagnosis not present

## 2023-08-05 DIAGNOSIS — Z1231 Encounter for screening mammogram for malignant neoplasm of breast: Secondary | ICD-10-CM | POA: Diagnosis not present

## 2023-08-07 DIAGNOSIS — M25511 Pain in right shoulder: Secondary | ICD-10-CM | POA: Diagnosis not present

## 2023-08-07 DIAGNOSIS — M67813 Other specified disorders of tendon, right shoulder: Secondary | ICD-10-CM | POA: Diagnosis not present

## 2023-08-14 DIAGNOSIS — M25511 Pain in right shoulder: Secondary | ICD-10-CM | POA: Diagnosis not present

## 2023-08-14 DIAGNOSIS — M67813 Other specified disorders of tendon, right shoulder: Secondary | ICD-10-CM | POA: Diagnosis not present

## 2023-08-28 DIAGNOSIS — M25511 Pain in right shoulder: Secondary | ICD-10-CM | POA: Diagnosis not present

## 2023-08-28 DIAGNOSIS — M67813 Other specified disorders of tendon, right shoulder: Secondary | ICD-10-CM | POA: Diagnosis not present

## 2023-09-03 DIAGNOSIS — M67813 Other specified disorders of tendon, right shoulder: Secondary | ICD-10-CM | POA: Diagnosis not present

## 2023-09-07 DIAGNOSIS — M67813 Other specified disorders of tendon, right shoulder: Secondary | ICD-10-CM | POA: Diagnosis not present

## 2023-09-15 DIAGNOSIS — M67813 Other specified disorders of tendon, right shoulder: Secondary | ICD-10-CM | POA: Diagnosis not present

## 2023-10-13 DIAGNOSIS — Z0189 Encounter for other specified special examinations: Secondary | ICD-10-CM | POA: Diagnosis not present

## 2023-10-13 DIAGNOSIS — M75121 Complete rotator cuff tear or rupture of right shoulder, not specified as traumatic: Secondary | ICD-10-CM | POA: Diagnosis not present

## 2023-11-05 DIAGNOSIS — Z0189 Encounter for other specified special examinations: Secondary | ICD-10-CM | POA: Diagnosis not present

## 2023-11-10 DIAGNOSIS — Z01818 Encounter for other preprocedural examination: Secondary | ICD-10-CM | POA: Diagnosis not present

## 2023-11-19 DIAGNOSIS — M75121 Complete rotator cuff tear or rupture of right shoulder, not specified as traumatic: Secondary | ICD-10-CM | POA: Diagnosis not present

## 2023-11-19 DIAGNOSIS — M24011 Loose body in right shoulder: Secondary | ICD-10-CM | POA: Diagnosis not present

## 2023-11-19 DIAGNOSIS — Z7984 Long term (current) use of oral hypoglycemic drugs: Secondary | ICD-10-CM | POA: Diagnosis not present

## 2023-11-19 DIAGNOSIS — M7521 Bicipital tendinitis, right shoulder: Secondary | ICD-10-CM | POA: Diagnosis not present

## 2023-11-19 DIAGNOSIS — S46211A Strain of muscle, fascia and tendon of other parts of biceps, right arm, initial encounter: Secondary | ICD-10-CM | POA: Diagnosis not present

## 2023-11-19 DIAGNOSIS — Z7982 Long term (current) use of aspirin: Secondary | ICD-10-CM | POA: Diagnosis not present

## 2023-11-19 DIAGNOSIS — M65811 Other synovitis and tenosynovitis, right shoulder: Secondary | ICD-10-CM | POA: Diagnosis not present

## 2023-11-19 DIAGNOSIS — S43431A Superior glenoid labrum lesion of right shoulder, initial encounter: Secondary | ICD-10-CM | POA: Diagnosis not present

## 2023-11-19 DIAGNOSIS — M7551 Bursitis of right shoulder: Secondary | ICD-10-CM | POA: Diagnosis not present

## 2023-11-19 DIAGNOSIS — M75111 Incomplete rotator cuff tear or rupture of right shoulder, not specified as traumatic: Secondary | ICD-10-CM | POA: Diagnosis not present

## 2023-11-19 DIAGNOSIS — Z7985 Long-term (current) use of injectable non-insulin antidiabetic drugs: Secondary | ICD-10-CM | POA: Diagnosis not present

## 2023-11-19 DIAGNOSIS — M19011 Primary osteoarthritis, right shoulder: Secondary | ICD-10-CM | POA: Diagnosis not present

## 2023-11-19 DIAGNOSIS — M7541 Impingement syndrome of right shoulder: Secondary | ICD-10-CM | POA: Diagnosis not present

## 2023-11-26 DIAGNOSIS — M25511 Pain in right shoulder: Secondary | ICD-10-CM | POA: Diagnosis not present

## 2023-11-26 DIAGNOSIS — M9907 Segmental and somatic dysfunction of upper extremity: Secondary | ICD-10-CM | POA: Diagnosis not present
